# Patient Record
Sex: Female | Born: 1940 | Race: White | Hispanic: No | Smoking: Never smoker
Health system: Southern US, Community
[De-identification: ages and names within clinical notes are randomized; demographics above are authoritative.]

## PROBLEM LIST (undated history)

## (undated) DIAGNOSIS — R079 Chest pain, unspecified: Secondary | ICD-10-CM

## (undated) DIAGNOSIS — F419 Anxiety disorder, unspecified: Secondary | ICD-10-CM

## (undated) DIAGNOSIS — K5909 Other constipation: Secondary | ICD-10-CM

## (undated) DIAGNOSIS — Z853 Personal history of malignant neoplasm of breast: Secondary | ICD-10-CM

## (undated) DIAGNOSIS — F329 Major depressive disorder, single episode, unspecified: Secondary | ICD-10-CM

## (undated) DIAGNOSIS — H3320 Serous retinal detachment, unspecified eye: Secondary | ICD-10-CM

## (undated) DIAGNOSIS — E785 Hyperlipidemia, unspecified: Secondary | ICD-10-CM

## (undated) DIAGNOSIS — F32A Depression, unspecified: Secondary | ICD-10-CM

## (undated) DIAGNOSIS — C50212 Malignant neoplasm of upper-inner quadrant of left female breast: Secondary | ICD-10-CM

## (undated) DIAGNOSIS — K219 Gastro-esophageal reflux disease without esophagitis: Secondary | ICD-10-CM

## (undated) DIAGNOSIS — E039 Hypothyroidism, unspecified: Secondary | ICD-10-CM

## (undated) DIAGNOSIS — I10 Essential (primary) hypertension: Secondary | ICD-10-CM

## (undated) HISTORY — DX: Hyperlipidemia, unspecified: E78.5

## (undated) HISTORY — PX: CHOLECYSTECTOMY: SHX55

## (undated) HISTORY — PX: APPENDECTOMY: SHX54

## (undated) HISTORY — PX: MASTECTOMY: SHX3

## (undated) HISTORY — DX: Serous retinal detachment, unspecified eye: H33.20

## (undated) HISTORY — DX: Anxiety disorder, unspecified: F41.9

## (undated) HISTORY — DX: Gastro-esophageal reflux disease without esophagitis: K21.9

## (undated) HISTORY — PX: BREAST LUMPECTOMY: SHX2

## (undated) HISTORY — DX: Chest pain, unspecified: R07.9

## (undated) HISTORY — DX: Essential (primary) hypertension: I10

## (undated) HISTORY — PX: THYROIDECTOMY: SHX17

## (undated) HISTORY — DX: Personal history of malignant neoplasm of breast: Z85.3

## (undated) HISTORY — DX: Malignant neoplasm of upper-inner quadrant of left female breast: C50.212

## (undated) HISTORY — DX: Other constipation: K59.09

## (undated) HISTORY — DX: Depression, unspecified: F32.A

## (undated) HISTORY — DX: Hypothyroidism, unspecified: E03.9

---
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
## 1998-08-25 ENCOUNTER — Encounter: Payer: Self-pay | Admitting: Obstetrics & Gynecology

## 1998-08-25 ENCOUNTER — Ambulatory Visit (HOSPITAL_COMMUNITY): Admission: RE | Admit: 1998-08-25 | Discharge: 1998-08-25 | Payer: Self-pay | Admitting: Obstetrics & Gynecology

## 2003-07-30 ENCOUNTER — Ambulatory Visit (HOSPITAL_COMMUNITY): Admission: RE | Admit: 2003-07-30 | Discharge: 2003-07-30 | Payer: Self-pay | Admitting: Orthopedic Surgery

## 2010-01-27 DIAGNOSIS — C50212 Malignant neoplasm of upper-inner quadrant of left female breast: Secondary | ICD-10-CM | POA: Insufficient documentation

## 2011-05-10 HISTORY — PX: ESOPHAGOGASTRODUODENOSCOPY: SHX1529

## 2011-07-05 HISTORY — PX: COLONOSCOPY: SHX174

## 2014-02-11 DIAGNOSIS — C50212 Malignant neoplasm of upper-inner quadrant of left female breast: Secondary | ICD-10-CM | POA: Diagnosis not present

## 2014-02-11 DIAGNOSIS — Z9889 Other specified postprocedural states: Secondary | ICD-10-CM | POA: Diagnosis not present

## 2014-02-11 DIAGNOSIS — Z853 Personal history of malignant neoplasm of breast: Secondary | ICD-10-CM | POA: Diagnosis not present

## 2014-02-11 DIAGNOSIS — R928 Other abnormal and inconclusive findings on diagnostic imaging of breast: Secondary | ICD-10-CM | POA: Diagnosis not present

## 2014-03-05 DIAGNOSIS — I10 Essential (primary) hypertension: Secondary | ICD-10-CM | POA: Diagnosis not present

## 2014-03-05 DIAGNOSIS — E785 Hyperlipidemia, unspecified: Secondary | ICD-10-CM | POA: Diagnosis not present

## 2014-03-05 DIAGNOSIS — R079 Chest pain, unspecified: Secondary | ICD-10-CM | POA: Diagnosis not present

## 2014-03-05 DIAGNOSIS — R9430 Abnormal result of cardiovascular function study, unspecified: Secondary | ICD-10-CM | POA: Diagnosis not present

## 2014-04-14 DIAGNOSIS — E780 Pure hypercholesterolemia: Secondary | ICD-10-CM | POA: Diagnosis not present

## 2014-04-14 DIAGNOSIS — I209 Angina pectoris, unspecified: Secondary | ICD-10-CM | POA: Diagnosis not present

## 2014-04-14 DIAGNOSIS — I2510 Atherosclerotic heart disease of native coronary artery without angina pectoris: Secondary | ICD-10-CM | POA: Diagnosis not present

## 2014-04-27 DIAGNOSIS — H01005 Unspecified blepharitis left lower eyelid: Secondary | ICD-10-CM | POA: Diagnosis not present

## 2014-04-27 DIAGNOSIS — H01002 Unspecified blepharitis right lower eyelid: Secondary | ICD-10-CM | POA: Diagnosis not present

## 2014-04-27 DIAGNOSIS — H01004 Unspecified blepharitis left upper eyelid: Secondary | ICD-10-CM | POA: Diagnosis not present

## 2014-04-27 DIAGNOSIS — H01001 Unspecified blepharitis right upper eyelid: Secondary | ICD-10-CM | POA: Diagnosis not present

## 2014-06-03 DIAGNOSIS — R062 Wheezing: Secondary | ICD-10-CM | POA: Diagnosis not present

## 2014-06-21 DIAGNOSIS — J309 Allergic rhinitis, unspecified: Secondary | ICD-10-CM | POA: Diagnosis not present

## 2014-06-21 DIAGNOSIS — J209 Acute bronchitis, unspecified: Secondary | ICD-10-CM | POA: Diagnosis not present

## 2014-07-03 DIAGNOSIS — Z0000 Encounter for general adult medical examination without abnormal findings: Secondary | ICD-10-CM | POA: Diagnosis not present

## 2014-07-03 DIAGNOSIS — Z79899 Other long term (current) drug therapy: Secondary | ICD-10-CM | POA: Diagnosis not present

## 2014-07-03 DIAGNOSIS — E782 Mixed hyperlipidemia: Secondary | ICD-10-CM | POA: Diagnosis not present

## 2014-07-23 DIAGNOSIS — E7800 Pure hypercholesterolemia, unspecified: Secondary | ICD-10-CM

## 2014-07-23 DIAGNOSIS — R0789 Other chest pain: Secondary | ICD-10-CM

## 2014-07-23 DIAGNOSIS — R079 Chest pain, unspecified: Secondary | ICD-10-CM | POA: Diagnosis not present

## 2014-07-23 HISTORY — DX: Other chest pain: R07.89

## 2014-07-23 HISTORY — DX: Pure hypercholesterolemia, unspecified: E78.00

## 2014-08-26 DIAGNOSIS — I10 Essential (primary) hypertension: Secondary | ICD-10-CM | POA: Diagnosis not present

## 2014-08-26 DIAGNOSIS — L259 Unspecified contact dermatitis, unspecified cause: Secondary | ICD-10-CM | POA: Diagnosis not present

## 2014-09-07 DIAGNOSIS — H2000 Unspecified acute and subacute iridocyclitis: Secondary | ICD-10-CM | POA: Diagnosis not present

## 2014-09-15 DIAGNOSIS — H2000 Unspecified acute and subacute iridocyclitis: Secondary | ICD-10-CM | POA: Diagnosis not present

## 2014-09-18 DIAGNOSIS — F329 Major depressive disorder, single episode, unspecified: Secondary | ICD-10-CM | POA: Diagnosis not present

## 2014-09-18 DIAGNOSIS — Z79899 Other long term (current) drug therapy: Secondary | ICD-10-CM | POA: Diagnosis not present

## 2014-09-18 DIAGNOSIS — E039 Hypothyroidism, unspecified: Secondary | ICD-10-CM | POA: Diagnosis not present

## 2014-09-18 DIAGNOSIS — R252 Cramp and spasm: Secondary | ICD-10-CM | POA: Diagnosis not present

## 2014-09-30 DIAGNOSIS — H2000 Unspecified acute and subacute iridocyclitis: Secondary | ICD-10-CM | POA: Diagnosis not present

## 2014-10-08 DIAGNOSIS — M791 Myalgia: Secondary | ICD-10-CM | POA: Diagnosis not present

## 2014-10-08 DIAGNOSIS — M4316 Spondylolisthesis, lumbar region: Secondary | ICD-10-CM | POA: Diagnosis not present

## 2014-10-08 DIAGNOSIS — M545 Low back pain: Secondary | ICD-10-CM | POA: Diagnosis not present

## 2014-10-14 DIAGNOSIS — H2000 Unspecified acute and subacute iridocyclitis: Secondary | ICD-10-CM | POA: Diagnosis not present

## 2014-10-15 DIAGNOSIS — H2000 Unspecified acute and subacute iridocyclitis: Secondary | ICD-10-CM | POA: Diagnosis not present

## 2014-10-15 DIAGNOSIS — Z111 Encounter for screening for respiratory tuberculosis: Secondary | ICD-10-CM | POA: Diagnosis not present

## 2014-10-15 DIAGNOSIS — Z23 Encounter for immunization: Secondary | ICD-10-CM | POA: Diagnosis not present

## 2014-10-21 DIAGNOSIS — H2000 Unspecified acute and subacute iridocyclitis: Secondary | ICD-10-CM | POA: Diagnosis not present

## 2014-11-09 DIAGNOSIS — R55 Syncope and collapse: Secondary | ICD-10-CM

## 2014-11-09 DIAGNOSIS — R0789 Other chest pain: Secondary | ICD-10-CM | POA: Diagnosis not present

## 2014-11-09 DIAGNOSIS — I10 Essential (primary) hypertension: Secondary | ICD-10-CM

## 2014-11-09 DIAGNOSIS — E785 Hyperlipidemia, unspecified: Secondary | ICD-10-CM

## 2014-11-09 HISTORY — DX: Essential (primary) hypertension: I10

## 2014-11-09 HISTORY — DX: Hyperlipidemia, unspecified: E78.5

## 2014-11-09 HISTORY — DX: Syncope and collapse: R55

## 2014-11-10 DIAGNOSIS — H2000 Unspecified acute and subacute iridocyclitis: Secondary | ICD-10-CM | POA: Diagnosis not present

## 2014-11-27 DIAGNOSIS — R1011 Right upper quadrant pain: Secondary | ICD-10-CM | POA: Diagnosis not present

## 2014-11-27 DIAGNOSIS — K210 Gastro-esophageal reflux disease with esophagitis: Secondary | ICD-10-CM | POA: Diagnosis not present

## 2014-12-02 DIAGNOSIS — K838 Other specified diseases of biliary tract: Secondary | ICD-10-CM | POA: Diagnosis not present

## 2014-12-02 DIAGNOSIS — R109 Unspecified abdominal pain: Secondary | ICD-10-CM | POA: Diagnosis not present

## 2014-12-02 DIAGNOSIS — R110 Nausea: Secondary | ICD-10-CM | POA: Diagnosis not present

## 2014-12-03 DIAGNOSIS — K210 Gastro-esophageal reflux disease with esophagitis: Secondary | ICD-10-CM | POA: Diagnosis not present

## 2014-12-03 DIAGNOSIS — R1013 Epigastric pain: Secondary | ICD-10-CM | POA: Diagnosis not present

## 2014-12-03 DIAGNOSIS — R1011 Right upper quadrant pain: Secondary | ICD-10-CM | POA: Diagnosis not present

## 2014-12-04 DIAGNOSIS — R935 Abnormal findings on diagnostic imaging of other abdominal regions, including retroperitoneum: Secondary | ICD-10-CM | POA: Diagnosis not present

## 2014-12-04 DIAGNOSIS — M549 Dorsalgia, unspecified: Secondary | ICD-10-CM | POA: Diagnosis not present

## 2014-12-04 DIAGNOSIS — K838 Other specified diseases of biliary tract: Secondary | ICD-10-CM | POA: Diagnosis not present

## 2014-12-04 DIAGNOSIS — R933 Abnormal findings on diagnostic imaging of other parts of digestive tract: Secondary | ICD-10-CM | POA: Diagnosis not present

## 2014-12-07 DIAGNOSIS — R1013 Epigastric pain: Secondary | ICD-10-CM | POA: Diagnosis not present

## 2014-12-07 DIAGNOSIS — K8050 Calculus of bile duct without cholangitis or cholecystitis without obstruction: Secondary | ICD-10-CM | POA: Diagnosis not present

## 2014-12-07 DIAGNOSIS — K838 Other specified diseases of biliary tract: Secondary | ICD-10-CM | POA: Diagnosis not present

## 2014-12-07 DIAGNOSIS — K8043 Calculus of bile duct with acute cholecystitis with obstruction: Secondary | ICD-10-CM | POA: Diagnosis not present

## 2014-12-07 DIAGNOSIS — I10 Essential (primary) hypertension: Secondary | ICD-10-CM | POA: Diagnosis not present

## 2014-12-08 DIAGNOSIS — Z880 Allergy status to penicillin: Secondary | ICD-10-CM | POA: Diagnosis not present

## 2014-12-08 DIAGNOSIS — C50919 Malignant neoplasm of unspecified site of unspecified female breast: Secondary | ICD-10-CM | POA: Diagnosis not present

## 2014-12-08 DIAGNOSIS — E860 Dehydration: Secondary | ICD-10-CM | POA: Diagnosis not present

## 2014-12-08 DIAGNOSIS — R1084 Generalized abdominal pain: Secondary | ICD-10-CM | POA: Diagnosis not present

## 2014-12-08 DIAGNOSIS — K668 Other specified disorders of peritoneum: Secondary | ICD-10-CM | POA: Diagnosis not present

## 2014-12-08 DIAGNOSIS — K9171 Accidental puncture and laceration of a digestive system organ or structure during a digestive system procedure: Secondary | ICD-10-CM | POA: Diagnosis not present

## 2014-12-08 DIAGNOSIS — E039 Hypothyroidism, unspecified: Secondary | ICD-10-CM | POA: Diagnosis not present

## 2014-12-08 DIAGNOSIS — J9811 Atelectasis: Secondary | ICD-10-CM | POA: Diagnosis not present

## 2014-12-08 DIAGNOSIS — R0902 Hypoxemia: Secondary | ICD-10-CM | POA: Diagnosis not present

## 2014-12-08 DIAGNOSIS — Z7982 Long term (current) use of aspirin: Secondary | ICD-10-CM | POA: Diagnosis not present

## 2014-12-08 DIAGNOSIS — Z885 Allergy status to narcotic agent status: Secondary | ICD-10-CM | POA: Diagnosis not present

## 2014-12-08 DIAGNOSIS — I10 Essential (primary) hypertension: Secondary | ICD-10-CM | POA: Diagnosis not present

## 2014-12-08 DIAGNOSIS — F418 Other specified anxiety disorders: Secondary | ICD-10-CM | POA: Diagnosis not present

## 2014-12-08 DIAGNOSIS — K8070 Calculus of gallbladder and bile duct without cholecystitis without obstruction: Secondary | ICD-10-CM | POA: Diagnosis not present

## 2014-12-08 DIAGNOSIS — R112 Nausea with vomiting, unspecified: Secondary | ICD-10-CM | POA: Diagnosis not present

## 2014-12-08 DIAGNOSIS — K8050 Calculus of bile duct without cholangitis or cholecystitis without obstruction: Secondary | ICD-10-CM | POA: Diagnosis not present

## 2014-12-08 DIAGNOSIS — K8020 Calculus of gallbladder without cholecystitis without obstruction: Secondary | ICD-10-CM | POA: Diagnosis not present

## 2014-12-08 DIAGNOSIS — R0689 Other abnormalities of breathing: Secondary | ICD-10-CM | POA: Diagnosis not present

## 2014-12-08 DIAGNOSIS — R0602 Shortness of breath: Secondary | ICD-10-CM | POA: Diagnosis not present

## 2014-12-08 DIAGNOSIS — Z853 Personal history of malignant neoplasm of breast: Secondary | ICD-10-CM | POA: Diagnosis not present

## 2014-12-08 DIAGNOSIS — Z881 Allergy status to other antibiotic agents status: Secondary | ICD-10-CM | POA: Diagnosis not present

## 2014-12-08 DIAGNOSIS — Z79899 Other long term (current) drug therapy: Secondary | ICD-10-CM | POA: Diagnosis not present

## 2014-12-08 DIAGNOSIS — R109 Unspecified abdominal pain: Secondary | ICD-10-CM | POA: Diagnosis not present

## 2014-12-08 DIAGNOSIS — Z882 Allergy status to sulfonamides status: Secondary | ICD-10-CM | POA: Diagnosis not present

## 2014-12-08 DIAGNOSIS — K219 Gastro-esophageal reflux disease without esophagitis: Secondary | ICD-10-CM | POA: Diagnosis not present

## 2014-12-08 DIAGNOSIS — K859 Acute pancreatitis without necrosis or infection, unspecified: Secondary | ICD-10-CM | POA: Diagnosis not present

## 2014-12-08 DIAGNOSIS — Z4682 Encounter for fitting and adjustment of non-vascular catheter: Secondary | ICD-10-CM | POA: Diagnosis not present

## 2014-12-08 DIAGNOSIS — Z23 Encounter for immunization: Secondary | ICD-10-CM | POA: Diagnosis not present

## 2014-12-08 DIAGNOSIS — Z888 Allergy status to other drugs, medicaments and biological substances status: Secondary | ICD-10-CM | POA: Diagnosis not present

## 2014-12-08 DIAGNOSIS — R935 Abnormal findings on diagnostic imaging of other abdominal regions, including retroperitoneum: Secondary | ICD-10-CM | POA: Diagnosis not present

## 2014-12-08 DIAGNOSIS — K5909 Other constipation: Secondary | ICD-10-CM | POA: Diagnosis not present

## 2014-12-14 DIAGNOSIS — Z7982 Long term (current) use of aspirin: Secondary | ICD-10-CM | POA: Diagnosis not present

## 2014-12-14 DIAGNOSIS — K668 Other specified disorders of peritoneum: Secondary | ICD-10-CM | POA: Diagnosis not present

## 2014-12-14 DIAGNOSIS — K5909 Other constipation: Secondary | ICD-10-CM | POA: Diagnosis not present

## 2014-12-14 DIAGNOSIS — K859 Acute pancreatitis without necrosis or infection, unspecified: Secondary | ICD-10-CM | POA: Diagnosis not present

## 2014-12-14 DIAGNOSIS — Z888 Allergy status to other drugs, medicaments and biological substances status: Secondary | ICD-10-CM | POA: Diagnosis not present

## 2014-12-14 DIAGNOSIS — R0689 Other abnormalities of breathing: Secondary | ICD-10-CM | POA: Diagnosis not present

## 2014-12-14 DIAGNOSIS — F418 Other specified anxiety disorders: Secondary | ICD-10-CM | POA: Diagnosis not present

## 2014-12-14 DIAGNOSIS — I10 Essential (primary) hypertension: Secondary | ICD-10-CM | POA: Diagnosis not present

## 2014-12-14 DIAGNOSIS — E039 Hypothyroidism, unspecified: Secondary | ICD-10-CM | POA: Diagnosis not present

## 2014-12-14 DIAGNOSIS — Z853 Personal history of malignant neoplasm of breast: Secondary | ICD-10-CM | POA: Diagnosis not present

## 2014-12-14 DIAGNOSIS — Z79899 Other long term (current) drug therapy: Secondary | ICD-10-CM | POA: Diagnosis not present

## 2014-12-14 DIAGNOSIS — K9171 Accidental puncture and laceration of a digestive system organ or structure during a digestive system procedure: Secondary | ICD-10-CM | POA: Diagnosis not present

## 2014-12-14 DIAGNOSIS — K8070 Calculus of gallbladder and bile duct without cholecystitis without obstruction: Secondary | ICD-10-CM | POA: Diagnosis not present

## 2014-12-14 DIAGNOSIS — Z880 Allergy status to penicillin: Secondary | ICD-10-CM | POA: Diagnosis not present

## 2014-12-14 DIAGNOSIS — Z881 Allergy status to other antibiotic agents status: Secondary | ICD-10-CM | POA: Diagnosis not present

## 2014-12-14 DIAGNOSIS — Z23 Encounter for immunization: Secondary | ICD-10-CM | POA: Diagnosis not present

## 2014-12-14 DIAGNOSIS — Z882 Allergy status to sulfonamides status: Secondary | ICD-10-CM | POA: Diagnosis not present

## 2014-12-14 DIAGNOSIS — Z885 Allergy status to narcotic agent status: Secondary | ICD-10-CM | POA: Diagnosis not present

## 2014-12-14 DIAGNOSIS — K219 Gastro-esophageal reflux disease without esophagitis: Secondary | ICD-10-CM | POA: Diagnosis not present

## 2014-12-14 DIAGNOSIS — E860 Dehydration: Secondary | ICD-10-CM | POA: Diagnosis not present

## 2014-12-15 DIAGNOSIS — K8020 Calculus of gallbladder without cholecystitis without obstruction: Secondary | ICD-10-CM | POA: Diagnosis not present

## 2014-12-15 DIAGNOSIS — R2681 Unsteadiness on feet: Secondary | ICD-10-CM | POA: Diagnosis not present

## 2014-12-15 DIAGNOSIS — K8050 Calculus of bile duct without cholangitis or cholecystitis without obstruction: Secondary | ICD-10-CM | POA: Diagnosis not present

## 2014-12-15 DIAGNOSIS — K832 Perforation of bile duct: Secondary | ICD-10-CM | POA: Diagnosis not present

## 2014-12-16 DIAGNOSIS — K811 Chronic cholecystitis: Secondary | ICD-10-CM | POA: Diagnosis not present

## 2014-12-16 DIAGNOSIS — K8020 Calculus of gallbladder without cholecystitis without obstruction: Secondary | ICD-10-CM | POA: Diagnosis not present

## 2014-12-16 DIAGNOSIS — K832 Perforation of bile duct: Secondary | ICD-10-CM | POA: Diagnosis not present

## 2014-12-16 DIAGNOSIS — R2681 Unsteadiness on feet: Secondary | ICD-10-CM | POA: Diagnosis not present

## 2014-12-16 DIAGNOSIS — R1011 Right upper quadrant pain: Secondary | ICD-10-CM | POA: Diagnosis not present

## 2014-12-16 DIAGNOSIS — R1013 Epigastric pain: Secondary | ICD-10-CM | POA: Diagnosis not present

## 2014-12-16 DIAGNOSIS — R110 Nausea: Secondary | ICD-10-CM | POA: Diagnosis not present

## 2014-12-16 DIAGNOSIS — K8050 Calculus of bile duct without cholangitis or cholecystitis without obstruction: Secondary | ICD-10-CM | POA: Diagnosis not present

## 2014-12-18 DIAGNOSIS — K832 Perforation of bile duct: Secondary | ICD-10-CM | POA: Diagnosis not present

## 2014-12-18 DIAGNOSIS — K8020 Calculus of gallbladder without cholecystitis without obstruction: Secondary | ICD-10-CM | POA: Diagnosis not present

## 2014-12-18 DIAGNOSIS — R2681 Unsteadiness on feet: Secondary | ICD-10-CM | POA: Diagnosis not present

## 2014-12-18 DIAGNOSIS — K8050 Calculus of bile duct without cholangitis or cholecystitis without obstruction: Secondary | ICD-10-CM | POA: Diagnosis not present

## 2014-12-21 DIAGNOSIS — K8050 Calculus of bile duct without cholangitis or cholecystitis without obstruction: Secondary | ICD-10-CM | POA: Diagnosis not present

## 2014-12-21 DIAGNOSIS — R2681 Unsteadiness on feet: Secondary | ICD-10-CM | POA: Diagnosis not present

## 2014-12-21 DIAGNOSIS — K8020 Calculus of gallbladder without cholecystitis without obstruction: Secondary | ICD-10-CM | POA: Diagnosis not present

## 2014-12-21 DIAGNOSIS — K832 Perforation of bile duct: Secondary | ICD-10-CM | POA: Diagnosis not present

## 2014-12-23 DIAGNOSIS — K8020 Calculus of gallbladder without cholecystitis without obstruction: Secondary | ICD-10-CM | POA: Diagnosis not present

## 2014-12-23 DIAGNOSIS — R2681 Unsteadiness on feet: Secondary | ICD-10-CM | POA: Diagnosis not present

## 2014-12-23 DIAGNOSIS — K832 Perforation of bile duct: Secondary | ICD-10-CM | POA: Diagnosis not present

## 2014-12-23 DIAGNOSIS — K8050 Calculus of bile duct without cholangitis or cholecystitis without obstruction: Secondary | ICD-10-CM | POA: Diagnosis not present

## 2014-12-25 DIAGNOSIS — K8050 Calculus of bile duct without cholangitis or cholecystitis without obstruction: Secondary | ICD-10-CM | POA: Diagnosis not present

## 2014-12-25 DIAGNOSIS — K8020 Calculus of gallbladder without cholecystitis without obstruction: Secondary | ICD-10-CM | POA: Diagnosis not present

## 2014-12-25 DIAGNOSIS — R2681 Unsteadiness on feet: Secondary | ICD-10-CM | POA: Diagnosis not present

## 2014-12-25 DIAGNOSIS — K832 Perforation of bile duct: Secondary | ICD-10-CM | POA: Diagnosis not present

## 2014-12-28 DIAGNOSIS — E7800 Pure hypercholesterolemia, unspecified: Secondary | ICD-10-CM | POA: Diagnosis not present

## 2014-12-28 DIAGNOSIS — R0789 Other chest pain: Secondary | ICD-10-CM | POA: Diagnosis not present

## 2014-12-28 DIAGNOSIS — I10 Essential (primary) hypertension: Secondary | ICD-10-CM | POA: Diagnosis not present

## 2014-12-28 DIAGNOSIS — E785 Hyperlipidemia, unspecified: Secondary | ICD-10-CM | POA: Diagnosis not present

## 2015-01-04 DIAGNOSIS — K811 Chronic cholecystitis: Secondary | ICD-10-CM | POA: Diagnosis not present

## 2015-01-04 DIAGNOSIS — Z79899 Other long term (current) drug therapy: Secondary | ICD-10-CM | POA: Diagnosis not present

## 2015-01-04 DIAGNOSIS — K8010 Calculus of gallbladder with chronic cholecystitis without obstruction: Secondary | ICD-10-CM | POA: Diagnosis not present

## 2015-01-04 DIAGNOSIS — K8020 Calculus of gallbladder without cholecystitis without obstruction: Secondary | ICD-10-CM | POA: Diagnosis not present

## 2015-01-06 DIAGNOSIS — K8020 Calculus of gallbladder without cholecystitis without obstruction: Secondary | ICD-10-CM | POA: Diagnosis not present

## 2015-01-06 DIAGNOSIS — K832 Perforation of bile duct: Secondary | ICD-10-CM | POA: Diagnosis not present

## 2015-01-06 DIAGNOSIS — R2681 Unsteadiness on feet: Secondary | ICD-10-CM | POA: Diagnosis not present

## 2015-01-06 DIAGNOSIS — K8050 Calculus of bile duct without cholangitis or cholecystitis without obstruction: Secondary | ICD-10-CM | POA: Diagnosis not present

## 2015-01-07 DIAGNOSIS — R2681 Unsteadiness on feet: Secondary | ICD-10-CM | POA: Diagnosis not present

## 2015-01-07 DIAGNOSIS — K8020 Calculus of gallbladder without cholecystitis without obstruction: Secondary | ICD-10-CM | POA: Diagnosis not present

## 2015-01-07 DIAGNOSIS — K832 Perforation of bile duct: Secondary | ICD-10-CM | POA: Diagnosis not present

## 2015-01-07 DIAGNOSIS — K8050 Calculus of bile duct without cholangitis or cholecystitis without obstruction: Secondary | ICD-10-CM | POA: Diagnosis not present

## 2015-01-12 DIAGNOSIS — K8050 Calculus of bile duct without cholangitis or cholecystitis without obstruction: Secondary | ICD-10-CM | POA: Diagnosis not present

## 2015-01-12 DIAGNOSIS — R2681 Unsteadiness on feet: Secondary | ICD-10-CM | POA: Diagnosis not present

## 2015-01-12 DIAGNOSIS — K8020 Calculus of gallbladder without cholecystitis without obstruction: Secondary | ICD-10-CM | POA: Diagnosis not present

## 2015-01-12 DIAGNOSIS — K832 Perforation of bile duct: Secondary | ICD-10-CM | POA: Diagnosis not present

## 2015-01-21 DIAGNOSIS — R1032 Left lower quadrant pain: Secondary | ICD-10-CM | POA: Diagnosis not present

## 2015-01-21 DIAGNOSIS — G8929 Other chronic pain: Secondary | ICD-10-CM | POA: Diagnosis not present

## 2015-01-21 DIAGNOSIS — J069 Acute upper respiratory infection, unspecified: Secondary | ICD-10-CM | POA: Diagnosis not present

## 2015-01-21 DIAGNOSIS — R1031 Right lower quadrant pain: Secondary | ICD-10-CM | POA: Diagnosis not present

## 2015-03-01 DIAGNOSIS — Z853 Personal history of malignant neoplasm of breast: Secondary | ICD-10-CM | POA: Diagnosis not present

## 2015-03-01 DIAGNOSIS — M8580 Other specified disorders of bone density and structure, unspecified site: Secondary | ICD-10-CM | POA: Diagnosis not present

## 2016-06-14 DIAGNOSIS — Z853 Personal history of malignant neoplasm of breast: Secondary | ICD-10-CM | POA: Diagnosis not present

## 2016-06-14 DIAGNOSIS — D229 Melanocytic nevi, unspecified: Secondary | ICD-10-CM | POA: Diagnosis not present

## 2016-10-13 DIAGNOSIS — H40052 Ocular hypertension, left eye: Secondary | ICD-10-CM | POA: Diagnosis not present

## 2016-10-27 DIAGNOSIS — J329 Chronic sinusitis, unspecified: Secondary | ICD-10-CM | POA: Diagnosis not present

## 2016-10-27 DIAGNOSIS — J40 Bronchitis, not specified as acute or chronic: Secondary | ICD-10-CM | POA: Diagnosis not present

## 2016-10-27 DIAGNOSIS — R739 Hyperglycemia, unspecified: Secondary | ICD-10-CM | POA: Diagnosis not present

## 2016-10-27 DIAGNOSIS — E039 Hypothyroidism, unspecified: Secondary | ICD-10-CM | POA: Diagnosis not present

## 2016-10-27 DIAGNOSIS — Z6823 Body mass index (BMI) 23.0-23.9, adult: Secondary | ICD-10-CM | POA: Diagnosis not present

## 2016-11-06 DIAGNOSIS — M436 Torticollis: Secondary | ICD-10-CM | POA: Diagnosis not present

## 2016-11-13 DIAGNOSIS — L570 Actinic keratosis: Secondary | ICD-10-CM | POA: Diagnosis not present

## 2016-11-13 DIAGNOSIS — D0471 Carcinoma in situ of skin of right lower limb, including hip: Secondary | ICD-10-CM | POA: Diagnosis not present

## 2016-11-24 DIAGNOSIS — M9901 Segmental and somatic dysfunction of cervical region: Secondary | ICD-10-CM | POA: Diagnosis not present

## 2016-11-24 DIAGNOSIS — M542 Cervicalgia: Secondary | ICD-10-CM | POA: Diagnosis not present

## 2016-11-24 DIAGNOSIS — M9902 Segmental and somatic dysfunction of thoracic region: Secondary | ICD-10-CM | POA: Diagnosis not present

## 2016-11-27 DIAGNOSIS — M9902 Segmental and somatic dysfunction of thoracic region: Secondary | ICD-10-CM | POA: Diagnosis not present

## 2016-11-27 DIAGNOSIS — M542 Cervicalgia: Secondary | ICD-10-CM | POA: Diagnosis not present

## 2016-11-27 DIAGNOSIS — M9901 Segmental and somatic dysfunction of cervical region: Secondary | ICD-10-CM | POA: Diagnosis not present

## 2016-11-29 DIAGNOSIS — M9902 Segmental and somatic dysfunction of thoracic region: Secondary | ICD-10-CM | POA: Diagnosis not present

## 2016-11-29 DIAGNOSIS — M542 Cervicalgia: Secondary | ICD-10-CM | POA: Diagnosis not present

## 2016-11-29 DIAGNOSIS — M9901 Segmental and somatic dysfunction of cervical region: Secondary | ICD-10-CM | POA: Diagnosis not present

## 2016-12-04 DIAGNOSIS — Z6824 Body mass index (BMI) 24.0-24.9, adult: Secondary | ICD-10-CM | POA: Diagnosis not present

## 2016-12-04 DIAGNOSIS — H6691 Otitis media, unspecified, right ear: Secondary | ICD-10-CM | POA: Diagnosis not present

## 2016-12-04 DIAGNOSIS — I998 Other disorder of circulatory system: Secondary | ICD-10-CM | POA: Diagnosis not present

## 2016-12-04 DIAGNOSIS — H6090 Unspecified otitis externa, unspecified ear: Secondary | ICD-10-CM | POA: Diagnosis not present

## 2016-12-13 DIAGNOSIS — H04123 Dry eye syndrome of bilateral lacrimal glands: Secondary | ICD-10-CM | POA: Diagnosis not present

## 2017-01-08 DIAGNOSIS — Z23 Encounter for immunization: Secondary | ICD-10-CM | POA: Diagnosis not present

## 2017-01-23 DIAGNOSIS — H0100A Unspecified blepharitis right eye, upper and lower eyelids: Secondary | ICD-10-CM | POA: Diagnosis not present

## 2017-01-23 DIAGNOSIS — H0100B Unspecified blepharitis left eye, upper and lower eyelids: Secondary | ICD-10-CM | POA: Diagnosis not present

## 2017-02-09 DIAGNOSIS — H40052 Ocular hypertension, left eye: Secondary | ICD-10-CM | POA: Diagnosis not present

## 2017-03-12 DIAGNOSIS — H40052 Ocular hypertension, left eye: Secondary | ICD-10-CM | POA: Diagnosis not present

## 2017-03-26 DIAGNOSIS — L719 Rosacea, unspecified: Secondary | ICD-10-CM | POA: Diagnosis not present

## 2017-03-26 DIAGNOSIS — L820 Inflamed seborrheic keratosis: Secondary | ICD-10-CM | POA: Diagnosis not present

## 2017-03-26 DIAGNOSIS — D0471 Carcinoma in situ of skin of right lower limb, including hip: Secondary | ICD-10-CM | POA: Diagnosis not present

## 2017-03-27 DIAGNOSIS — H10503 Unspecified blepharoconjunctivitis, bilateral: Secondary | ICD-10-CM | POA: Diagnosis not present

## 2017-03-28 DIAGNOSIS — K210 Gastro-esophageal reflux disease with esophagitis: Secondary | ICD-10-CM | POA: Diagnosis not present

## 2017-03-28 DIAGNOSIS — E039 Hypothyroidism, unspecified: Secondary | ICD-10-CM | POA: Diagnosis not present

## 2017-04-04 DIAGNOSIS — Z853 Personal history of malignant neoplasm of breast: Secondary | ICD-10-CM | POA: Diagnosis not present

## 2017-04-04 DIAGNOSIS — Z79899 Other long term (current) drug therapy: Secondary | ICD-10-CM | POA: Diagnosis not present

## 2017-04-04 DIAGNOSIS — R413 Other amnesia: Secondary | ICD-10-CM | POA: Diagnosis not present

## 2017-04-18 DIAGNOSIS — C50212 Malignant neoplasm of upper-inner quadrant of left female breast: Secondary | ICD-10-CM | POA: Diagnosis not present

## 2017-04-18 DIAGNOSIS — R928 Other abnormal and inconclusive findings on diagnostic imaging of breast: Secondary | ICD-10-CM | POA: Diagnosis not present

## 2017-04-18 DIAGNOSIS — N6312 Unspecified lump in the right breast, upper inner quadrant: Secondary | ICD-10-CM | POA: Diagnosis not present

## 2017-04-20 DIAGNOSIS — R928 Other abnormal and inconclusive findings on diagnostic imaging of breast: Secondary | ICD-10-CM | POA: Diagnosis not present

## 2017-04-25 DIAGNOSIS — R921 Mammographic calcification found on diagnostic imaging of breast: Secondary | ICD-10-CM | POA: Diagnosis not present

## 2017-04-25 DIAGNOSIS — C50411 Malignant neoplasm of upper-outer quadrant of right female breast: Secondary | ICD-10-CM | POA: Diagnosis not present

## 2017-04-25 DIAGNOSIS — R928 Other abnormal and inconclusive findings on diagnostic imaging of breast: Secondary | ICD-10-CM | POA: Diagnosis not present

## 2017-04-25 DIAGNOSIS — C50211 Malignant neoplasm of upper-inner quadrant of right female breast: Secondary | ICD-10-CM

## 2017-04-25 DIAGNOSIS — N6311 Unspecified lump in the right breast, upper outer quadrant: Secondary | ICD-10-CM | POA: Diagnosis not present

## 2017-04-25 DIAGNOSIS — C50911 Malignant neoplasm of unspecified site of right female breast: Secondary | ICD-10-CM | POA: Diagnosis not present

## 2017-04-25 DIAGNOSIS — N642 Atrophy of breast: Secondary | ICD-10-CM | POA: Diagnosis not present

## 2017-04-25 DIAGNOSIS — N6312 Unspecified lump in the right breast, upper inner quadrant: Secondary | ICD-10-CM | POA: Diagnosis not present

## 2017-04-25 HISTORY — DX: Malignant neoplasm of upper-inner quadrant of right female breast: C50.211

## 2017-05-03 DIAGNOSIS — M8580 Other specified disorders of bone density and structure, unspecified site: Secondary | ICD-10-CM | POA: Diagnosis not present

## 2017-05-03 DIAGNOSIS — C50212 Malignant neoplasm of upper-inner quadrant of left female breast: Secondary | ICD-10-CM | POA: Diagnosis not present

## 2017-05-03 DIAGNOSIS — Z853 Personal history of malignant neoplasm of breast: Secondary | ICD-10-CM | POA: Diagnosis not present

## 2017-05-03 DIAGNOSIS — Z170 Estrogen receptor positive status [ER+]: Secondary | ICD-10-CM | POA: Diagnosis not present

## 2017-05-03 DIAGNOSIS — M8589 Other specified disorders of bone density and structure, multiple sites: Secondary | ICD-10-CM | POA: Diagnosis not present

## 2017-05-03 DIAGNOSIS — C50911 Malignant neoplasm of unspecified site of right female breast: Secondary | ICD-10-CM | POA: Diagnosis not present

## 2017-05-03 DIAGNOSIS — Z923 Personal history of irradiation: Secondary | ICD-10-CM | POA: Diagnosis not present

## 2017-05-03 DIAGNOSIS — R413 Other amnesia: Secondary | ICD-10-CM | POA: Diagnosis not present

## 2017-05-07 DIAGNOSIS — C50411 Malignant neoplasm of upper-outer quadrant of right female breast: Secondary | ICD-10-CM

## 2017-05-07 DIAGNOSIS — Z170 Estrogen receptor positive status [ER+]: Secondary | ICD-10-CM | POA: Insufficient documentation

## 2017-05-07 HISTORY — DX: Estrogen receptor positive status (ER+): Z17.0

## 2017-05-07 HISTORY — DX: Malignant neoplasm of upper-outer quadrant of right female breast: C50.411

## 2017-05-10 DIAGNOSIS — N6312 Unspecified lump in the right breast, upper inner quadrant: Secondary | ICD-10-CM | POA: Diagnosis not present

## 2017-05-10 DIAGNOSIS — C50411 Malignant neoplasm of upper-outer quadrant of right female breast: Secondary | ICD-10-CM | POA: Diagnosis not present

## 2017-05-10 DIAGNOSIS — R21 Rash and other nonspecific skin eruption: Secondary | ICD-10-CM | POA: Diagnosis not present

## 2017-05-10 DIAGNOSIS — C50212 Malignant neoplasm of upper-inner quadrant of left female breast: Secondary | ICD-10-CM | POA: Diagnosis not present

## 2017-05-10 DIAGNOSIS — C50412 Malignant neoplasm of upper-outer quadrant of left female breast: Secondary | ICD-10-CM | POA: Diagnosis not present

## 2017-05-14 DIAGNOSIS — Z8669 Personal history of other diseases of the nervous system and sense organs: Secondary | ICD-10-CM | POA: Diagnosis not present

## 2017-05-14 DIAGNOSIS — H40052 Ocular hypertension, left eye: Secondary | ICD-10-CM | POA: Diagnosis not present

## 2017-05-15 DIAGNOSIS — H40052 Ocular hypertension, left eye: Secondary | ICD-10-CM | POA: Diagnosis not present

## 2017-06-01 DIAGNOSIS — K219 Gastro-esophageal reflux disease without esophagitis: Secondary | ICD-10-CM | POA: Diagnosis not present

## 2017-06-01 DIAGNOSIS — C50411 Malignant neoplasm of upper-outer quadrant of right female breast: Secondary | ICD-10-CM | POA: Diagnosis not present

## 2017-06-01 DIAGNOSIS — Z01810 Encounter for preprocedural cardiovascular examination: Secondary | ICD-10-CM | POA: Diagnosis not present

## 2017-06-01 DIAGNOSIS — I10 Essential (primary) hypertension: Secondary | ICD-10-CM | POA: Diagnosis not present

## 2017-06-01 DIAGNOSIS — Z170 Estrogen receptor positive status [ER+]: Secondary | ICD-10-CM | POA: Diagnosis not present

## 2017-06-01 DIAGNOSIS — C50011 Malignant neoplasm of nipple and areola, right female breast: Secondary | ICD-10-CM | POA: Diagnosis not present

## 2017-06-01 DIAGNOSIS — Z79899 Other long term (current) drug therapy: Secondary | ICD-10-CM | POA: Diagnosis not present

## 2017-06-01 DIAGNOSIS — E079 Disorder of thyroid, unspecified: Secondary | ICD-10-CM | POA: Diagnosis not present

## 2017-06-01 DIAGNOSIS — C50211 Malignant neoplasm of upper-inner quadrant of right female breast: Secondary | ICD-10-CM | POA: Diagnosis not present

## 2017-06-12 DIAGNOSIS — Z09 Encounter for follow-up examination after completed treatment for conditions other than malignant neoplasm: Secondary | ICD-10-CM

## 2017-06-12 HISTORY — DX: Encounter for follow-up examination after completed treatment for conditions other than malignant neoplasm: Z09

## 2017-06-21 DIAGNOSIS — H40052 Ocular hypertension, left eye: Secondary | ICD-10-CM | POA: Diagnosis not present

## 2017-07-06 DIAGNOSIS — C50811 Malignant neoplasm of overlapping sites of right female breast: Secondary | ICD-10-CM | POA: Diagnosis not present

## 2017-07-06 DIAGNOSIS — Z79899 Other long term (current) drug therapy: Secondary | ICD-10-CM | POA: Diagnosis not present

## 2017-07-06 DIAGNOSIS — Z853 Personal history of malignant neoplasm of breast: Secondary | ICD-10-CM | POA: Diagnosis not present

## 2017-07-06 DIAGNOSIS — R531 Weakness: Secondary | ICD-10-CM | POA: Diagnosis not present

## 2017-07-06 DIAGNOSIS — M8589 Other specified disorders of bone density and structure, multiple sites: Secondary | ICD-10-CM | POA: Diagnosis not present

## 2017-07-06 DIAGNOSIS — M8580 Other specified disorders of bone density and structure, unspecified site: Secondary | ICD-10-CM | POA: Diagnosis not present

## 2017-07-06 DIAGNOSIS — Z923 Personal history of irradiation: Secondary | ICD-10-CM | POA: Diagnosis not present

## 2017-07-06 DIAGNOSIS — Z170 Estrogen receptor positive status [ER+]: Secondary | ICD-10-CM | POA: Diagnosis not present

## 2017-07-06 DIAGNOSIS — C50911 Malignant neoplasm of unspecified site of right female breast: Secondary | ICD-10-CM | POA: Diagnosis not present

## 2017-07-06 DIAGNOSIS — R590 Localized enlarged lymph nodes: Secondary | ICD-10-CM | POA: Diagnosis not present

## 2017-07-06 DIAGNOSIS — R413 Other amnesia: Secondary | ICD-10-CM | POA: Diagnosis not present

## 2017-07-11 DIAGNOSIS — H40052 Ocular hypertension, left eye: Secondary | ICD-10-CM | POA: Diagnosis not present

## 2017-07-16 DIAGNOSIS — M8589 Other specified disorders of bone density and structure, multiple sites: Secondary | ICD-10-CM | POA: Diagnosis not present

## 2017-07-24 DIAGNOSIS — C50212 Malignant neoplasm of upper-inner quadrant of left female breast: Secondary | ICD-10-CM | POA: Diagnosis not present

## 2017-07-24 DIAGNOSIS — C50911 Malignant neoplasm of unspecified site of right female breast: Secondary | ICD-10-CM | POA: Diagnosis not present

## 2017-07-27 DIAGNOSIS — Z853 Personal history of malignant neoplasm of breast: Secondary | ICD-10-CM | POA: Diagnosis not present

## 2017-07-27 DIAGNOSIS — Z79811 Long term (current) use of aromatase inhibitors: Secondary | ICD-10-CM

## 2017-07-27 DIAGNOSIS — R590 Localized enlarged lymph nodes: Secondary | ICD-10-CM

## 2017-07-27 DIAGNOSIS — M85859 Other specified disorders of bone density and structure, unspecified thigh: Secondary | ICD-10-CM

## 2017-07-27 DIAGNOSIS — C50212 Malignant neoplasm of upper-inner quadrant of left female breast: Secondary | ICD-10-CM | POA: Diagnosis not present

## 2017-07-27 DIAGNOSIS — C50911 Malignant neoplasm of unspecified site of right female breast: Secondary | ICD-10-CM | POA: Diagnosis not present

## 2017-07-27 DIAGNOSIS — Z923 Personal history of irradiation: Secondary | ICD-10-CM | POA: Diagnosis not present

## 2017-07-27 DIAGNOSIS — R413 Other amnesia: Secondary | ICD-10-CM

## 2017-07-27 DIAGNOSIS — Z170 Estrogen receptor positive status [ER+]: Secondary | ICD-10-CM | POA: Diagnosis not present

## 2017-07-27 DIAGNOSIS — C50811 Malignant neoplasm of overlapping sites of right female breast: Secondary | ICD-10-CM | POA: Diagnosis not present

## 2017-07-27 DIAGNOSIS — M8589 Other specified disorders of bone density and structure, multiple sites: Secondary | ICD-10-CM | POA: Diagnosis not present

## 2017-07-31 DIAGNOSIS — H40052 Ocular hypertension, left eye: Secondary | ICD-10-CM | POA: Diagnosis not present

## 2017-08-06 DIAGNOSIS — C50212 Malignant neoplasm of upper-inner quadrant of left female breast: Secondary | ICD-10-CM | POA: Diagnosis not present

## 2017-08-14 DIAGNOSIS — M79621 Pain in right upper arm: Secondary | ICD-10-CM | POA: Insufficient documentation

## 2017-08-14 HISTORY — DX: Pain in right upper arm: M79.621

## 2017-09-11 DIAGNOSIS — C50411 Malignant neoplasm of upper-outer quadrant of right female breast: Secondary | ICD-10-CM | POA: Diagnosis not present

## 2017-09-11 DIAGNOSIS — M79621 Pain in right upper arm: Secondary | ICD-10-CM | POA: Diagnosis not present

## 2017-09-11 DIAGNOSIS — Z170 Estrogen receptor positive status [ER+]: Secondary | ICD-10-CM | POA: Diagnosis not present

## 2017-09-13 DIAGNOSIS — C50411 Malignant neoplasm of upper-outer quadrant of right female breast: Secondary | ICD-10-CM | POA: Diagnosis not present

## 2017-09-13 DIAGNOSIS — C50212 Malignant neoplasm of upper-inner quadrant of left female breast: Secondary | ICD-10-CM | POA: Diagnosis not present

## 2017-09-13 DIAGNOSIS — N6489 Other specified disorders of breast: Secondary | ICD-10-CM | POA: Diagnosis not present

## 2017-09-13 DIAGNOSIS — M79621 Pain in right upper arm: Secondary | ICD-10-CM | POA: Diagnosis not present

## 2017-09-18 DIAGNOSIS — H01001 Unspecified blepharitis right upper eyelid: Secondary | ICD-10-CM | POA: Diagnosis not present

## 2017-09-26 DIAGNOSIS — Z853 Personal history of malignant neoplasm of breast: Secondary | ICD-10-CM | POA: Diagnosis not present

## 2017-09-26 DIAGNOSIS — M8589 Other specified disorders of bone density and structure, multiple sites: Secondary | ICD-10-CM | POA: Diagnosis not present

## 2017-09-26 DIAGNOSIS — C50911 Malignant neoplasm of unspecified site of right female breast: Secondary | ICD-10-CM | POA: Diagnosis not present

## 2017-09-26 DIAGNOSIS — R413 Other amnesia: Secondary | ICD-10-CM

## 2017-09-26 DIAGNOSIS — Z923 Personal history of irradiation: Secondary | ICD-10-CM | POA: Diagnosis not present

## 2017-09-26 DIAGNOSIS — M8580 Other specified disorders of bone density and structure, unspecified site: Secondary | ICD-10-CM

## 2017-09-26 DIAGNOSIS — Z9011 Acquired absence of right breast and nipple: Secondary | ICD-10-CM

## 2017-09-26 DIAGNOSIS — R222 Localized swelling, mass and lump, trunk: Secondary | ICD-10-CM

## 2017-09-26 DIAGNOSIS — Z79811 Long term (current) use of aromatase inhibitors: Secondary | ICD-10-CM

## 2017-09-26 DIAGNOSIS — Z170 Estrogen receptor positive status [ER+]: Secondary | ICD-10-CM | POA: Diagnosis not present

## 2017-09-26 DIAGNOSIS — D171 Benign lipomatous neoplasm of skin and subcutaneous tissue of trunk: Secondary | ICD-10-CM

## 2017-10-01 DIAGNOSIS — R222 Localized swelling, mass and lump, trunk: Secondary | ICD-10-CM | POA: Insufficient documentation

## 2017-10-01 DIAGNOSIS — Z170 Estrogen receptor positive status [ER+]: Secondary | ICD-10-CM | POA: Diagnosis not present

## 2017-10-01 DIAGNOSIS — C50411 Malignant neoplasm of upper-outer quadrant of right female breast: Secondary | ICD-10-CM | POA: Diagnosis not present

## 2017-10-01 HISTORY — DX: Localized swelling, mass and lump, trunk: R22.2

## 2017-10-10 DIAGNOSIS — Z853 Personal history of malignant neoplasm of breast: Secondary | ICD-10-CM | POA: Diagnosis not present

## 2017-10-10 DIAGNOSIS — N6312 Unspecified lump in the right breast, upper inner quadrant: Secondary | ICD-10-CM | POA: Diagnosis not present

## 2017-10-10 DIAGNOSIS — R928 Other abnormal and inconclusive findings on diagnostic imaging of breast: Secondary | ICD-10-CM | POA: Diagnosis not present

## 2017-10-10 DIAGNOSIS — N6489 Other specified disorders of breast: Secondary | ICD-10-CM | POA: Diagnosis not present

## 2017-10-17 DIAGNOSIS — R928 Other abnormal and inconclusive findings on diagnostic imaging of breast: Secondary | ICD-10-CM | POA: Diagnosis not present

## 2017-10-17 DIAGNOSIS — N63 Unspecified lump in unspecified breast: Secondary | ICD-10-CM | POA: Diagnosis not present

## 2017-10-17 DIAGNOSIS — N641 Fat necrosis of breast: Secondary | ICD-10-CM | POA: Diagnosis not present

## 2017-10-17 DIAGNOSIS — Z9011 Acquired absence of right breast and nipple: Secondary | ICD-10-CM | POA: Diagnosis not present

## 2017-10-17 DIAGNOSIS — N6312 Unspecified lump in the right breast, upper inner quadrant: Secondary | ICD-10-CM | POA: Diagnosis not present

## 2017-10-17 DIAGNOSIS — N6311 Unspecified lump in the right breast, upper outer quadrant: Secondary | ICD-10-CM | POA: Diagnosis not present

## 2017-10-18 DIAGNOSIS — J40 Bronchitis, not specified as acute or chronic: Secondary | ICD-10-CM | POA: Diagnosis not present

## 2017-10-18 DIAGNOSIS — J329 Chronic sinusitis, unspecified: Secondary | ICD-10-CM | POA: Diagnosis not present

## 2017-10-19 DIAGNOSIS — H40052 Ocular hypertension, left eye: Secondary | ICD-10-CM | POA: Diagnosis not present

## 2017-11-26 DIAGNOSIS — H8120 Vestibular neuronitis, unspecified ear: Secondary | ICD-10-CM | POA: Diagnosis not present

## 2017-11-26 DIAGNOSIS — K210 Gastro-esophageal reflux disease with esophagitis: Secondary | ICD-10-CM | POA: Diagnosis not present

## 2017-11-28 DIAGNOSIS — M8580 Other specified disorders of bone density and structure, unspecified site: Secondary | ICD-10-CM

## 2017-11-28 DIAGNOSIS — D171 Benign lipomatous neoplasm of skin and subcutaneous tissue of trunk: Secondary | ICD-10-CM

## 2017-11-28 DIAGNOSIS — Z853 Personal history of malignant neoplasm of breast: Secondary | ICD-10-CM | POA: Diagnosis not present

## 2017-11-28 DIAGNOSIS — Z923 Personal history of irradiation: Secondary | ICD-10-CM | POA: Diagnosis not present

## 2017-11-28 DIAGNOSIS — C50911 Malignant neoplasm of unspecified site of right female breast: Secondary | ICD-10-CM | POA: Diagnosis not present

## 2017-11-28 DIAGNOSIS — R413 Other amnesia: Secondary | ICD-10-CM

## 2017-11-28 DIAGNOSIS — M8589 Other specified disorders of bone density and structure, multiple sites: Secondary | ICD-10-CM | POA: Diagnosis not present

## 2017-11-28 DIAGNOSIS — Z170 Estrogen receptor positive status [ER+]: Secondary | ICD-10-CM | POA: Diagnosis not present

## 2017-11-28 DIAGNOSIS — R222 Localized swelling, mass and lump, trunk: Secondary | ICD-10-CM

## 2017-11-28 DIAGNOSIS — Z79811 Long term (current) use of aromatase inhibitors: Secondary | ICD-10-CM

## 2017-11-28 DIAGNOSIS — C50212 Malignant neoplasm of upper-inner quadrant of left female breast: Secondary | ICD-10-CM | POA: Diagnosis not present

## 2017-11-28 DIAGNOSIS — Z9011 Acquired absence of right breast and nipple: Secondary | ICD-10-CM

## 2017-12-03 DIAGNOSIS — H40052 Ocular hypertension, left eye: Secondary | ICD-10-CM | POA: Diagnosis not present

## 2017-12-05 DIAGNOSIS — Z23 Encounter for immunization: Secondary | ICD-10-CM | POA: Diagnosis not present

## 2017-12-17 DIAGNOSIS — H40052 Ocular hypertension, left eye: Secondary | ICD-10-CM | POA: Diagnosis not present

## 2017-12-27 DIAGNOSIS — K210 Gastro-esophageal reflux disease with esophagitis: Secondary | ICD-10-CM | POA: Diagnosis not present

## 2017-12-27 DIAGNOSIS — J40 Bronchitis, not specified as acute or chronic: Secondary | ICD-10-CM | POA: Diagnosis not present

## 2017-12-27 DIAGNOSIS — J329 Chronic sinusitis, unspecified: Secondary | ICD-10-CM | POA: Diagnosis not present

## 2017-12-27 DIAGNOSIS — R252 Cramp and spasm: Secondary | ICD-10-CM | POA: Diagnosis not present

## 2018-01-07 DIAGNOSIS — L600 Ingrowing nail: Secondary | ICD-10-CM | POA: Diagnosis not present

## 2018-01-14 DIAGNOSIS — L600 Ingrowing nail: Secondary | ICD-10-CM

## 2018-01-14 DIAGNOSIS — L03032 Cellulitis of left toe: Secondary | ICD-10-CM | POA: Insufficient documentation

## 2018-01-14 HISTORY — DX: Cellulitis of left toe: L03.032

## 2018-01-14 HISTORY — DX: Ingrowing nail: L60.0

## 2018-01-18 DIAGNOSIS — H40052 Ocular hypertension, left eye: Secondary | ICD-10-CM | POA: Diagnosis not present

## 2018-01-23 DIAGNOSIS — H40052 Ocular hypertension, left eye: Secondary | ICD-10-CM | POA: Diagnosis not present

## 2018-02-11 DIAGNOSIS — Z923 Personal history of irradiation: Secondary | ICD-10-CM | POA: Diagnosis not present

## 2018-02-11 DIAGNOSIS — R222 Localized swelling, mass and lump, trunk: Secondary | ICD-10-CM

## 2018-02-11 DIAGNOSIS — M8589 Other specified disorders of bone density and structure, multiple sites: Secondary | ICD-10-CM | POA: Diagnosis not present

## 2018-02-11 DIAGNOSIS — M8580 Other specified disorders of bone density and structure, unspecified site: Secondary | ICD-10-CM | POA: Diagnosis not present

## 2018-02-11 DIAGNOSIS — Z79899 Other long term (current) drug therapy: Secondary | ICD-10-CM | POA: Diagnosis not present

## 2018-02-11 DIAGNOSIS — Z9011 Acquired absence of right breast and nipple: Secondary | ICD-10-CM | POA: Diagnosis not present

## 2018-02-11 DIAGNOSIS — C50212 Malignant neoplasm of upper-inner quadrant of left female breast: Secondary | ICD-10-CM | POA: Diagnosis not present

## 2018-02-11 DIAGNOSIS — D171 Benign lipomatous neoplasm of skin and subcutaneous tissue of trunk: Secondary | ICD-10-CM

## 2018-02-11 DIAGNOSIS — R413 Other amnesia: Secondary | ICD-10-CM

## 2018-02-11 DIAGNOSIS — Z853 Personal history of malignant neoplasm of breast: Secondary | ICD-10-CM | POA: Diagnosis not present

## 2018-02-13 DIAGNOSIS — H40052 Ocular hypertension, left eye: Secondary | ICD-10-CM | POA: Diagnosis not present

## 2018-02-27 DIAGNOSIS — H2000 Unspecified acute and subacute iridocyclitis: Secondary | ICD-10-CM | POA: Diagnosis not present

## 2018-03-07 DIAGNOSIS — H2000 Unspecified acute and subacute iridocyclitis: Secondary | ICD-10-CM | POA: Diagnosis not present

## 2018-03-10 DIAGNOSIS — N3090 Cystitis, unspecified without hematuria: Secondary | ICD-10-CM | POA: Diagnosis not present

## 2018-03-10 DIAGNOSIS — N3001 Acute cystitis with hematuria: Secondary | ICD-10-CM | POA: Diagnosis not present

## 2018-04-09 DIAGNOSIS — H40052 Ocular hypertension, left eye: Secondary | ICD-10-CM | POA: Diagnosis not present

## 2018-04-30 DIAGNOSIS — H40052 Ocular hypertension, left eye: Secondary | ICD-10-CM | POA: Diagnosis not present

## 2018-06-17 DIAGNOSIS — C50911 Malignant neoplasm of unspecified site of right female breast: Secondary | ICD-10-CM | POA: Diagnosis not present

## 2018-06-17 DIAGNOSIS — R21 Rash and other nonspecific skin eruption: Secondary | ICD-10-CM | POA: Diagnosis not present

## 2018-07-08 DIAGNOSIS — H40052 Ocular hypertension, left eye: Secondary | ICD-10-CM | POA: Diagnosis not present

## 2018-07-29 DIAGNOSIS — Z9011 Acquired absence of right breast and nipple: Secondary | ICD-10-CM | POA: Diagnosis not present

## 2018-07-29 DIAGNOSIS — Z1231 Encounter for screening mammogram for malignant neoplasm of breast: Secondary | ICD-10-CM | POA: Diagnosis not present

## 2018-07-29 DIAGNOSIS — R928 Other abnormal and inconclusive findings on diagnostic imaging of breast: Secondary | ICD-10-CM | POA: Diagnosis not present

## 2018-07-30 DIAGNOSIS — Z853 Personal history of malignant neoplasm of breast: Secondary | ICD-10-CM | POA: Diagnosis not present

## 2018-07-30 DIAGNOSIS — M8589 Other specified disorders of bone density and structure, multiple sites: Secondary | ICD-10-CM | POA: Diagnosis not present

## 2018-07-31 DIAGNOSIS — D485 Neoplasm of uncertain behavior of skin: Secondary | ICD-10-CM | POA: Diagnosis not present

## 2018-07-31 DIAGNOSIS — L300 Nummular dermatitis: Secondary | ICD-10-CM | POA: Diagnosis not present

## 2018-07-31 DIAGNOSIS — L308 Other specified dermatitis: Secondary | ICD-10-CM | POA: Diagnosis not present

## 2018-07-31 DIAGNOSIS — L299 Pruritus, unspecified: Secondary | ICD-10-CM | POA: Diagnosis not present

## 2018-08-15 DIAGNOSIS — L300 Nummular dermatitis: Secondary | ICD-10-CM | POA: Diagnosis not present

## 2018-09-16 DIAGNOSIS — E039 Hypothyroidism, unspecified: Secondary | ICD-10-CM | POA: Diagnosis not present

## 2018-09-16 DIAGNOSIS — D519 Vitamin B12 deficiency anemia, unspecified: Secondary | ICD-10-CM | POA: Diagnosis not present

## 2018-09-16 DIAGNOSIS — Z79899 Other long term (current) drug therapy: Secondary | ICD-10-CM | POA: Diagnosis not present

## 2018-09-16 DIAGNOSIS — I10 Essential (primary) hypertension: Secondary | ICD-10-CM | POA: Diagnosis not present

## 2018-09-16 DIAGNOSIS — K210 Gastro-esophageal reflux disease with esophagitis: Secondary | ICD-10-CM | POA: Diagnosis not present

## 2018-09-16 DIAGNOSIS — R252 Cramp and spasm: Secondary | ICD-10-CM | POA: Diagnosis not present

## 2018-09-19 DIAGNOSIS — H40052 Ocular hypertension, left eye: Secondary | ICD-10-CM | POA: Diagnosis not present

## 2018-09-25 DIAGNOSIS — H40052 Ocular hypertension, left eye: Secondary | ICD-10-CM | POA: Diagnosis not present

## 2018-09-25 DIAGNOSIS — L300 Nummular dermatitis: Secondary | ICD-10-CM | POA: Diagnosis not present

## 2018-09-30 DIAGNOSIS — Z9011 Acquired absence of right breast and nipple: Secondary | ICD-10-CM | POA: Diagnosis not present

## 2018-09-30 DIAGNOSIS — C50212 Malignant neoplasm of upper-inner quadrant of left female breast: Secondary | ICD-10-CM | POA: Diagnosis not present

## 2018-11-01 DIAGNOSIS — M545 Low back pain: Secondary | ICD-10-CM | POA: Diagnosis not present

## 2018-11-06 DIAGNOSIS — C50212 Malignant neoplasm of upper-inner quadrant of left female breast: Secondary | ICD-10-CM | POA: Diagnosis not present

## 2018-11-06 DIAGNOSIS — M8589 Other specified disorders of bone density and structure, multiple sites: Secondary | ICD-10-CM | POA: Diagnosis not present

## 2018-11-11 DIAGNOSIS — M9902 Segmental and somatic dysfunction of thoracic region: Secondary | ICD-10-CM | POA: Diagnosis not present

## 2018-11-11 DIAGNOSIS — M9903 Segmental and somatic dysfunction of lumbar region: Secondary | ICD-10-CM | POA: Diagnosis not present

## 2018-11-11 DIAGNOSIS — M545 Low back pain: Secondary | ICD-10-CM | POA: Diagnosis not present

## 2018-11-11 DIAGNOSIS — M9905 Segmental and somatic dysfunction of pelvic region: Secondary | ICD-10-CM | POA: Diagnosis not present

## 2018-11-18 DIAGNOSIS — H40052 Ocular hypertension, left eye: Secondary | ICD-10-CM | POA: Diagnosis not present

## 2018-11-21 DIAGNOSIS — M5136 Other intervertebral disc degeneration, lumbar region: Secondary | ICD-10-CM | POA: Diagnosis not present

## 2018-11-23 DIAGNOSIS — M5136 Other intervertebral disc degeneration, lumbar region: Secondary | ICD-10-CM | POA: Diagnosis not present

## 2018-11-23 DIAGNOSIS — M48061 Spinal stenosis, lumbar region without neurogenic claudication: Secondary | ICD-10-CM | POA: Diagnosis not present

## 2018-11-23 DIAGNOSIS — M5126 Other intervertebral disc displacement, lumbar region: Secondary | ICD-10-CM | POA: Diagnosis not present

## 2018-11-26 DIAGNOSIS — M5136 Other intervertebral disc degeneration, lumbar region: Secondary | ICD-10-CM | POA: Diagnosis not present

## 2018-11-26 DIAGNOSIS — Z23 Encounter for immunization: Secondary | ICD-10-CM | POA: Diagnosis not present

## 2018-11-28 DIAGNOSIS — Z79899 Other long term (current) drug therapy: Secondary | ICD-10-CM | POA: Diagnosis not present

## 2018-11-28 DIAGNOSIS — M5416 Radiculopathy, lumbar region: Secondary | ICD-10-CM | POA: Diagnosis not present

## 2018-11-28 DIAGNOSIS — M5136 Other intervertebral disc degeneration, lumbar region: Secondary | ICD-10-CM | POA: Diagnosis not present

## 2019-01-07 DIAGNOSIS — H40052 Ocular hypertension, left eye: Secondary | ICD-10-CM | POA: Diagnosis not present

## 2019-02-17 DIAGNOSIS — H40052 Ocular hypertension, left eye: Secondary | ICD-10-CM | POA: Diagnosis not present

## 2019-02-27 DIAGNOSIS — L820 Inflamed seborrheic keratosis: Secondary | ICD-10-CM | POA: Diagnosis not present

## 2019-02-27 DIAGNOSIS — L578 Other skin changes due to chronic exposure to nonionizing radiation: Secondary | ICD-10-CM | POA: Diagnosis not present

## 2019-02-27 DIAGNOSIS — L728 Other follicular cysts of the skin and subcutaneous tissue: Secondary | ICD-10-CM | POA: Diagnosis not present

## 2019-02-27 DIAGNOSIS — L821 Other seborrheic keratosis: Secondary | ICD-10-CM | POA: Diagnosis not present

## 2019-03-17 DIAGNOSIS — H40052 Ocular hypertension, left eye: Secondary | ICD-10-CM | POA: Diagnosis not present

## 2019-04-06 DIAGNOSIS — I10 Essential (primary) hypertension: Secondary | ICD-10-CM | POA: Diagnosis not present

## 2019-04-15 DIAGNOSIS — C50212 Malignant neoplasm of upper-inner quadrant of left female breast: Secondary | ICD-10-CM | POA: Diagnosis not present

## 2019-04-15 DIAGNOSIS — E559 Vitamin D deficiency, unspecified: Secondary | ICD-10-CM | POA: Diagnosis not present

## 2019-04-15 DIAGNOSIS — D0511 Intraductal carcinoma in situ of right breast: Secondary | ICD-10-CM | POA: Diagnosis not present

## 2019-04-15 DIAGNOSIS — Z79899 Other long term (current) drug therapy: Secondary | ICD-10-CM | POA: Diagnosis not present

## 2019-04-15 DIAGNOSIS — M8589 Other specified disorders of bone density and structure, multiple sites: Secondary | ICD-10-CM | POA: Diagnosis not present

## 2019-04-18 ENCOUNTER — Encounter: Payer: Self-pay | Admitting: Gastroenterology

## 2019-04-22 DIAGNOSIS — M47814 Spondylosis without myelopathy or radiculopathy, thoracic region: Secondary | ICD-10-CM | POA: Diagnosis not present

## 2019-04-22 DIAGNOSIS — C50212 Malignant neoplasm of upper-inner quadrant of left female breast: Secondary | ICD-10-CM | POA: Diagnosis not present

## 2019-04-23 DIAGNOSIS — H40052 Ocular hypertension, left eye: Secondary | ICD-10-CM | POA: Diagnosis not present

## 2019-04-28 DIAGNOSIS — R531 Weakness: Secondary | ICD-10-CM | POA: Diagnosis not present

## 2019-04-28 DIAGNOSIS — L309 Dermatitis, unspecified: Secondary | ICD-10-CM | POA: Diagnosis not present

## 2019-04-28 DIAGNOSIS — D485 Neoplasm of uncertain behavior of skin: Secondary | ICD-10-CM | POA: Diagnosis not present

## 2019-04-28 DIAGNOSIS — L259 Unspecified contact dermatitis, unspecified cause: Secondary | ICD-10-CM | POA: Diagnosis not present

## 2019-05-02 ENCOUNTER — Other Ambulatory Visit: Payer: Self-pay

## 2019-05-06 DIAGNOSIS — L821 Other seborrheic keratosis: Secondary | ICD-10-CM | POA: Diagnosis not present

## 2019-05-06 DIAGNOSIS — L578 Other skin changes due to chronic exposure to nonionizing radiation: Secondary | ICD-10-CM | POA: Diagnosis not present

## 2019-05-06 DIAGNOSIS — L300 Nummular dermatitis: Secondary | ICD-10-CM | POA: Diagnosis not present

## 2019-05-07 DIAGNOSIS — K219 Gastro-esophageal reflux disease without esophagitis: Secondary | ICD-10-CM | POA: Diagnosis not present

## 2019-05-07 DIAGNOSIS — E039 Hypothyroidism, unspecified: Secondary | ICD-10-CM | POA: Diagnosis not present

## 2019-05-19 ENCOUNTER — Other Ambulatory Visit: Payer: Self-pay

## 2019-05-19 ENCOUNTER — Ambulatory Visit: Payer: Medicare Other | Admitting: Gastroenterology

## 2019-05-19 ENCOUNTER — Encounter: Payer: Self-pay | Admitting: Gastroenterology

## 2019-05-19 VITALS — BP 126/70 | HR 69 | Temp 97.5°F | Ht 64.5 in | Wt 147.4 lb

## 2019-05-19 DIAGNOSIS — R197 Diarrhea, unspecified: Secondary | ICD-10-CM | POA: Diagnosis not present

## 2019-05-19 DIAGNOSIS — K219 Gastro-esophageal reflux disease without esophagitis: Secondary | ICD-10-CM | POA: Diagnosis not present

## 2019-05-19 MED ORDER — PANTOPRAZOLE SODIUM 40 MG PO TBEC: 40.0000 mg | DELAYED_RELEASE_TABLET | Freq: Two times a day (BID) | ORAL | 3 refills | Status: DC

## 2019-05-19 MED ORDER — FAMOTIDINE 20 MG PO TABS: 20.0000 mg | ORAL_TABLET | Freq: Every day | ORAL | 3 refills | Status: DC

## 2019-05-19 NOTE — Patient Instructions (Signed)
If you are age 81 or older, your body mass index should be between 23-30. Your Body mass index is 29.47 kg/m. If this is out of the aforementioned range listed, please consider follow up with your Primary Care Provider.  If you are age 50 or younger, your body mass index should be between 19-25. Your Body mass index is 29.47 kg/m. If this is out of the aformentioned range listed, please consider follow up with your Primary Care Provider.   We have sent the following medications to your pharmacy for you to pick up at your convenience: Protonix  Pepcid  Stop Magnesium and Calcium supplements x 2 weeks.   Please call Dr. Leland Her nurse Larena Glassman, CMA)  in 2 weeks at [phone]  to let her now how you are doing.    Follow up in 8 weeks.   Thank you,  Dr. Jackquline Denmark

## 2019-05-19 NOTE — Progress Notes (Signed)
Chief Complaint:   Referring Provider:  No ref. provider found      ASSESSMENT AND PLAN;   #1. GERD with HH/belching  #2. RLQ/RUQ Abd pain  #3. Diarrhea d/t ? meds (Mg supplements)   Plan: -protonix 40mg  po bid, #60, 1/2 hr before breakfast and supper. -pepcid 20mg  po qhs -Stop mag/ca supplentts x 2 weeks. -If still with problems and the above WU is negative, proceed with CT scan abdo/pelvis followed by EGD. -Call in 2 weeks. -FU in 8 weeks.    HPI:    Holly Welch is a 79 y.o. female  C/O belching, upper abdominal pain/discomfort especially after eating with associated intermittent diarrhea x 3 to 4 months.  Also has been having right lower quadrant abdominal discomfort. She has previous history of constipation.  Denies having any melena or hematochezia.  Denies having any significant nausea or vomiting.  No weight loss.  She had normal CBC with hemoglobin 12.15 April 2019, normal BUN/creatinine, normal LFTs.  Her TSH was normal.  Protonix was increased.  She also has tried simethicone without any significant improvement.  Diarrhea has been intermittent with the frequency of 2-3 bowel movements per day especially after eating.  Mostly loose watery in consistency without nocturnal symptoms.  On further asking she does admit that her problems started when she started taking magnesium.    Past GI procedures: -Colonoscopy 06/2011 ([facility]): Mild sigmoid diverticulosis, small internal hemorrhoids, otherwise normal to TI. -EGD 05/2011: 3 cm HH, incidental gastric polyps s/p polypectomy. Bx-fundic gland polyps. The small bowel biopsies were negative for celiac disease.  Additional past medical history: -Stage IA L breast cancer 01/2010 treated with lumpectomy/PO XRT.  Unable to tolerate hormonal therapy.  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 Past Medical History:  Diagnosis Date  . Anxiety and depression   . Chronic constipation   .  Detached retina   . GERD (gastroesophageal reflux disease)   . History of left breast cancer   . Hypothyroid     Past Surgical History:  Procedure Laterality Date  . APPENDECTOMY    . BREAST LUMPECTOMY    . CHOLECYSTECTOMY    . COLONOSCOPY  07/05/2011   Mild sigmoid diverticulosis. Small internal hemorrhoids. Otherwise normal colonoscopy.   . ESOPHAGOGASTRODUODENOSCOPY  05/10/2011   Hiatal hernia. Incidental gastric polyp.   Marland Kitchen MASTECTOMY     partial on left 8 years ago and right was 2 years ago  . THYROIDECTOMY      Family History  Problem Relation Age of Onset  . Breast cancer Paternal Aunt   . Colon cancer Neg Hx   . Esophageal cancer Neg Hx     Social History   Tobacco Use  . Smoking status: Never Smoker  . Smokeless tobacco: Never Used  Substance Use Topics  . Alcohol use: Not Currently  . Drug use: Never    Current Outpatient Medications  Medication Sig Dispense Refill  . ALPRAZolam (XANAX) 0.25 MG tablet Take 0.5-1 tablets by mouth as needed.     . citalopram (CELEXA) 20 MG tablet Take 20 mg by mouth daily.    . famotidine (PEPCID) 20 MG tablet Take 20 mg by mouth daily.    Marland Kitchen gabapentin (NEURONTIN) 100 MG capsule Take 100 mg by mouth as needed.    Marland Kitchen levothyroxine (SYNTHROID) 75 MCG tablet Take 1 tablet by mouth daily.    Marland Kitchen lisinopril-hydrochlorothiazide (ZESTORETIC) 10-12.5 MG tablet Take 1 tablet by mouth daily.    Marland Kitchen  loratadine (CLARITIN) 10 MG tablet Take 20 mg by mouth daily with breakfast.    . MAGNESIUM PO Take 2 tablets by mouth daily.    . metoprolol tartrate (LOPRESSOR) 25 MG tablet Take 12.5 mg by mouth 2 (two) times daily.    . Multiple Minerals-Vitamins (CALCIUM & VIT D3 BONE HEALTH PO) Take 2 tablets by mouth daily. Chewables    . pantoprazole (PROTONIX) 40 MG tablet Take 40 mg by mouth daily.     No current facility-administered medications for this visit.    Allergies  Allergen Reactions  . Cefdinir Nausea Only  . Hydrocodone-Acetaminophen  Other (See Comments)  . Latuda [Lurasidone]   . Prednisone   . Codeine Other (See Comments) and Rash  . Penicillins Rash and Swelling  . Sulfa Antibiotics Rash    Review of Systems:  Constitutional: Denies fever, chills, diaphoresis, appetite change and fatigue.  HEENT: Denies photophobia, eye pain, redness, hearing loss, ear pain, congestion, sore throat, rhinorrhea, sneezing, mouth sores, neck pain, neck stiffness and tinnitus.   Respiratory: Denies SOB, DOE, cough, chest tightness,  and wheezing.   Cardiovascular: Denies chest pain, palpitations and leg swelling.  Genitourinary: Denies dysuria, urgency, frequency, hematuria, flank pain and difficulty urinating.  Musculoskeletal: Denies myalgias, back pain, joint swelling, arthralgias and gait problem.  Skin: No rash.  Neurological: Denies dizziness, seizures, syncope, weakness, light-headedness, numbness and headaches.  Hematological: Denies adenopathy. Easy bruising, personal or family bleeding history  Psychiatric/Behavioral: Has anxiety or depression     Physical Exam:    BP 126/70   Pulse 69   Temp (!) 97.5 F (36.4 C)   Ht 5' 4.5" (1.638 m)   Wt 174 lb 6 oz (79.1 kg)   BMI 29.47 kg/m  Wt Readings from Last 3 Encounters:  05/19/19 174 lb 6 oz (79.1 kg)   Constitutional:  Well-developed, in no acute distress. Psychiatric: Normal mood and affect. Behavior is normal. HEENT: Pupils normal.  Conjunctivae are normal. No scleral icterus. Neck supple.  Cardiovascular: Normal rate, regular rhythm. No edema Pulmonary/chest: Effort normal and breath sounds normal. No wheezing, rales or rhonchi. Abdominal: Soft, nondistended. Nontender. Bowel sounds active throughout. There are no masses palpable. No hepatomegaly. Rectal:  defered Neurological: Alert and oriented to person place and time. Skin: Skin is warm and dry. No rashes noted.  Data Reviewed: I have personally reviewed following labs and imaging studies    Carmell Austria, MD 05/19/2019, 10:31 AM  Cc: No ref. provider found

## 2019-05-20 ENCOUNTER — Telehealth: Payer: Self-pay | Admitting: Gastroenterology

## 2019-05-20 NOTE — Telephone Encounter (Signed)
Pls call pt, she states that she noticed a mistake on her summary visit and would like to rectify.

## 2019-05-20 NOTE — Telephone Encounter (Signed)
Patient says that on her AVS that her weight was recorded as 174 lb and she weighed 147.6 lbs yesterday. Patient would like weight corrected in the computer.

## 2019-05-21 NOTE — Telephone Encounter (Signed)
Its been corrected 

## 2019-05-22 ENCOUNTER — Telehealth: Payer: Self-pay | Admitting: Gastroenterology

## 2019-06-06 DIAGNOSIS — K219 Gastro-esophageal reflux disease without esophagitis: Secondary | ICD-10-CM | POA: Diagnosis not present

## 2019-06-06 DIAGNOSIS — I10 Essential (primary) hypertension: Secondary | ICD-10-CM | POA: Diagnosis not present

## 2019-06-18 ENCOUNTER — Telehealth: Payer: Self-pay | Admitting: Gastroenterology

## 2019-06-18 MED ORDER — FAMOTIDINE 40 MG PO TABS: 40.0000 mg | ORAL_TABLET | Freq: Every day | ORAL | 3 refills | Status: DC

## 2019-06-18 NOTE — Telephone Encounter (Signed)
Patient is calling with an update, patient states that she has held the Calcium and Magnesium but does not feel like that has helped any. Patient states that she has been taking the Pepcid 20 mg at bedtime but does not feel like it is working. Patient would like to know if you would up it to Pepcid 40 mg at bedtime.

## 2019-06-18 NOTE — Telephone Encounter (Signed)
I have called and left message for patient to return my call.  

## 2019-06-18 NOTE — Telephone Encounter (Signed)
Lets increase Pepcid to 40 mg p.o. nightly She needs to call us in 2 weeks to let us know how she is doing-even if she is better  RG

## 2019-06-18 NOTE — Telephone Encounter (Signed)
Pls call pt. She wants to update you on her progress.

## 2019-06-18 NOTE — Telephone Encounter (Signed)
I have sent prescription to patients pharmacy and left message for patient to return my call.

## 2019-07-03 DIAGNOSIS — H40052 Ocular hypertension, left eye: Secondary | ICD-10-CM | POA: Diagnosis not present

## 2019-07-07 DIAGNOSIS — K219 Gastro-esophageal reflux disease without esophagitis: Secondary | ICD-10-CM | POA: Diagnosis not present

## 2019-07-07 DIAGNOSIS — E039 Hypothyroidism, unspecified: Secondary | ICD-10-CM | POA: Diagnosis not present

## 2019-07-07 DIAGNOSIS — I10 Essential (primary) hypertension: Secondary | ICD-10-CM | POA: Diagnosis not present

## 2019-07-07 DIAGNOSIS — E1142 Type 2 diabetes mellitus with diabetic polyneuropathy: Secondary | ICD-10-CM | POA: Diagnosis not present

## 2019-07-21 DIAGNOSIS — M8589 Other specified disorders of bone density and structure, multiple sites: Secondary | ICD-10-CM | POA: Diagnosis not present

## 2019-07-21 DIAGNOSIS — M810 Age-related osteoporosis without current pathological fracture: Secondary | ICD-10-CM | POA: Diagnosis not present

## 2019-07-31 ENCOUNTER — Ambulatory Visit: Payer: Medicare Other | Admitting: Gastroenterology

## 2019-08-06 DIAGNOSIS — E782 Mixed hyperlipidemia: Secondary | ICD-10-CM | POA: Diagnosis not present

## 2019-08-06 DIAGNOSIS — I10 Essential (primary) hypertension: Secondary | ICD-10-CM | POA: Diagnosis not present

## 2019-08-12 DIAGNOSIS — Z9011 Acquired absence of right breast and nipple: Secondary | ICD-10-CM | POA: Diagnosis not present

## 2019-08-12 DIAGNOSIS — Z1231 Encounter for screening mammogram for malignant neoplasm of breast: Secondary | ICD-10-CM | POA: Diagnosis not present

## 2019-08-15 DIAGNOSIS — M810 Age-related osteoporosis without current pathological fracture: Secondary | ICD-10-CM | POA: Diagnosis not present

## 2019-08-15 DIAGNOSIS — E559 Vitamin D deficiency, unspecified: Secondary | ICD-10-CM | POA: Diagnosis not present

## 2019-08-15 DIAGNOSIS — M546 Pain in thoracic spine: Secondary | ICD-10-CM | POA: Diagnosis not present

## 2019-08-15 DIAGNOSIS — N641 Fat necrosis of breast: Secondary | ICD-10-CM | POA: Diagnosis not present

## 2019-08-15 DIAGNOSIS — Z79811 Long term (current) use of aromatase inhibitors: Secondary | ICD-10-CM | POA: Diagnosis not present

## 2019-08-15 DIAGNOSIS — C50212 Malignant neoplasm of upper-inner quadrant of left female breast: Secondary | ICD-10-CM | POA: Diagnosis not present

## 2019-08-15 DIAGNOSIS — E538 Deficiency of other specified B group vitamins: Secondary | ICD-10-CM | POA: Diagnosis not present

## 2019-08-15 DIAGNOSIS — D509 Iron deficiency anemia, unspecified: Secondary | ICD-10-CM | POA: Diagnosis not present

## 2019-08-15 DIAGNOSIS — Z853 Personal history of malignant neoplasm of breast: Secondary | ICD-10-CM | POA: Diagnosis not present

## 2019-08-15 DIAGNOSIS — E871 Hypo-osmolality and hyponatremia: Secondary | ICD-10-CM | POA: Diagnosis not present

## 2019-08-25 DIAGNOSIS — C50212 Malignant neoplasm of upper-inner quadrant of left female breast: Secondary | ICD-10-CM | POA: Diagnosis not present

## 2019-08-25 DIAGNOSIS — M8589 Other specified disorders of bone density and structure, multiple sites: Secondary | ICD-10-CM | POA: Diagnosis not present

## 2019-09-06 DIAGNOSIS — I10 Essential (primary) hypertension: Secondary | ICD-10-CM | POA: Diagnosis not present

## 2019-09-06 DIAGNOSIS — E782 Mixed hyperlipidemia: Secondary | ICD-10-CM | POA: Diagnosis not present

## 2019-09-29 DIAGNOSIS — Z79899 Other long term (current) drug therapy: Secondary | ICD-10-CM | POA: Diagnosis not present

## 2019-09-29 DIAGNOSIS — M545 Low back pain: Secondary | ICD-10-CM | POA: Diagnosis not present

## 2019-09-29 DIAGNOSIS — E039 Hypothyroidism, unspecified: Secondary | ICD-10-CM | POA: Diagnosis not present

## 2019-09-29 DIAGNOSIS — I10 Essential (primary) hypertension: Secondary | ICD-10-CM | POA: Diagnosis not present

## 2019-09-29 DIAGNOSIS — R739 Hyperglycemia, unspecified: Secondary | ICD-10-CM | POA: Diagnosis not present

## 2019-09-29 DIAGNOSIS — M4727 Other spondylosis with radiculopathy, lumbosacral region: Secondary | ICD-10-CM | POA: Diagnosis not present

## 2019-09-29 DIAGNOSIS — E782 Mixed hyperlipidemia: Secondary | ICD-10-CM | POA: Diagnosis not present

## 2019-10-01 DIAGNOSIS — C50212 Malignant neoplasm of upper-inner quadrant of left female breast: Secondary | ICD-10-CM | POA: Diagnosis not present

## 2019-10-07 DIAGNOSIS — I10 Essential (primary) hypertension: Secondary | ICD-10-CM | POA: Diagnosis not present

## 2019-10-07 DIAGNOSIS — E782 Mixed hyperlipidemia: Secondary | ICD-10-CM | POA: Diagnosis not present

## 2019-11-14 DIAGNOSIS — C50212 Malignant neoplasm of upper-inner quadrant of left female breast: Secondary | ICD-10-CM | POA: Diagnosis not present

## 2019-11-18 DIAGNOSIS — L300 Nummular dermatitis: Secondary | ICD-10-CM | POA: Diagnosis not present

## 2019-11-18 DIAGNOSIS — L570 Actinic keratosis: Secondary | ICD-10-CM | POA: Diagnosis not present

## 2019-11-26 ENCOUNTER — Other Ambulatory Visit: Payer: Self-pay | Admitting: Gastroenterology

## 2019-11-26 DIAGNOSIS — I10 Essential (primary) hypertension: Secondary | ICD-10-CM | POA: Diagnosis not present

## 2019-11-26 DIAGNOSIS — E782 Mixed hyperlipidemia: Secondary | ICD-10-CM | POA: Diagnosis not present

## 2019-11-26 DIAGNOSIS — C50911 Malignant neoplasm of unspecified site of right female breast: Secondary | ICD-10-CM | POA: Diagnosis not present

## 2019-11-26 DIAGNOSIS — Z23 Encounter for immunization: Secondary | ICD-10-CM | POA: Diagnosis not present

## 2019-11-26 DIAGNOSIS — Z0000 Encounter for general adult medical examination without abnormal findings: Secondary | ICD-10-CM | POA: Diagnosis not present

## 2019-11-26 DIAGNOSIS — E039 Hypothyroidism, unspecified: Secondary | ICD-10-CM | POA: Diagnosis not present

## 2019-12-07 DIAGNOSIS — I10 Essential (primary) hypertension: Secondary | ICD-10-CM | POA: Diagnosis not present

## 2019-12-07 DIAGNOSIS — E782 Mixed hyperlipidemia: Secondary | ICD-10-CM | POA: Diagnosis not present

## 2019-12-22 DIAGNOSIS — Z23 Encounter for immunization: Secondary | ICD-10-CM | POA: Diagnosis not present

## 2019-12-23 DIAGNOSIS — L821 Other seborrheic keratosis: Secondary | ICD-10-CM | POA: Diagnosis not present

## 2020-01-06 DIAGNOSIS — E7849 Other hyperlipidemia: Secondary | ICD-10-CM | POA: Diagnosis not present

## 2020-01-06 DIAGNOSIS — I10 Essential (primary) hypertension: Secondary | ICD-10-CM | POA: Diagnosis not present

## 2020-01-08 DIAGNOSIS — H40052 Ocular hypertension, left eye: Secondary | ICD-10-CM | POA: Diagnosis not present

## 2020-01-16 ENCOUNTER — Other Ambulatory Visit: Payer: Self-pay | Admitting: Hematology and Oncology

## 2020-01-16 ENCOUNTER — Other Ambulatory Visit: Payer: Self-pay | Admitting: Pharmacist

## 2020-01-16 DIAGNOSIS — M8589 Other specified disorders of bone density and structure, multiple sites: Secondary | ICD-10-CM

## 2020-01-16 DIAGNOSIS — Z170 Estrogen receptor positive status [ER+]: Secondary | ICD-10-CM

## 2020-01-16 DIAGNOSIS — C50211 Malignant neoplasm of upper-inner quadrant of right female breast: Secondary | ICD-10-CM

## 2020-01-16 DIAGNOSIS — C50212 Malignant neoplasm of upper-inner quadrant of left female breast: Secondary | ICD-10-CM

## 2020-01-16 HISTORY — DX: Other specified disorders of bone density and structure, multiple sites: M85.89

## 2020-01-21 ENCOUNTER — Telehealth: Payer: Self-pay | Admitting: Oncology

## 2020-01-21 NOTE — Telephone Encounter (Signed)
Patient called to verify Jan 2022 Appt's 

## 2020-01-23 DIAGNOSIS — T8522XA Displacement of intraocular lens, initial encounter: Secondary | ICD-10-CM | POA: Diagnosis not present

## 2020-01-28 NOTE — Progress Notes (Signed)
PT STABLE AT TIME OF DISCHARGE 

## 2020-02-06 DIAGNOSIS — I700 Atherosclerosis of aorta: Secondary | ICD-10-CM | POA: Diagnosis not present

## 2020-02-06 DIAGNOSIS — I10 Essential (primary) hypertension: Secondary | ICD-10-CM | POA: Diagnosis not present

## 2020-02-06 DIAGNOSIS — I201 Angina pectoris with documented spasm: Secondary | ICD-10-CM | POA: Diagnosis not present

## 2020-02-06 DIAGNOSIS — E039 Hypothyroidism, unspecified: Secondary | ICD-10-CM | POA: Diagnosis not present

## 2020-02-09 ENCOUNTER — Ambulatory Visit: Payer: Medicare Other

## 2020-02-09 NOTE — Progress Notes (Signed)
[facility]  [address] [place],  [place]  [address] [phone]  Clinic Day:  02/11/2020  Referring physician: Venetia Maxon, Sharon Mt, *   This document serves as a record of services personally performed by Hosie Poisson, MD. It was created on their behalf by Curry,Lauren E, a trained medical scribe. The creation of this record is based on the scribe's personal observations and the provider's statements to them.   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  She was treated with lumpectomy.  Pathology revealed a 0.5 cm invasive ductal carcinoma in the background of high-grade ductal carcinoma in situ with a negative sentinel node.  Estrogen and progesterone receptors were positive and HER 2 negative.  Adjuvant chemotherapy was not recommended.  She did received postoperative radiation to the left breast and then was placed on anastrozole 1 mg daily in May 2012. She to not tolerate anastrozole due to malaise, so was switched to letrozole and did not tolerate that either.  Eventually she refused all hormonal therapy.  She underwent cholecystectomy in December 2016 and was in the hospital for 7 days.    We scheduled her for mammogram in February 2019, which revealed 2 suspicious masses in the right breast and biopsy was recommended.  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  The imaging appeared to be a 1.5 cm lesion.  Estrogen and progesterone receptors were highly positive with HER 2 negative, and a Ki 67 of 25%.  A second biopsy at 1 o'clock was benign, but felt to be discordant.  We had been  concerned about her memory.  She underwent right mastectomy in April 2019.  Pathology revealed a multicentric invasive ductal carcinoma with 5 negative nodes.  One lesion was grade 3 and 15 mm in size, and the other was grade 1 and 13 mm lesion in size, for a stage IA (T1c N0 M0) hormone receptor positive right breast cancer.  Bone density scan in June 2019 revealed worsening osteopenia of the femur for a T-score of -2.4 and stable osteopenia of the spine with a T-score of -1.9.  She was placed on adjuvant hormonal therapy with letrozole 2.5 mg in June 2019. She initially tolerated this fairly well, but later stopped it on her own.  In August 2019, she develops to small nodules of the right mastectomy site.  Biopsy of both of these in September 2019 revealed fat necrosis.  Bone density scan from June revealed osteoporosis with a T-score of -2.7 of the right femur, previously -2.2, and a T-score of-2.4 in the spine, previously -1.9.  Left screening mammogram in July did not reveal any evidence of malignancy. Due to the osteoporosis, we recommended that she be placed on Prolia every 6 months and she received her 1st dose on July 19th.  She has been on and off hormonal therapy with letrozole and anastrozole, but currently is compliant.    INTERVAL HISTORY:  [name] is here for routine follow up prior to her next Prolia.  She continues anastrozole daily without significant difficulty.  She states that she has been fairly well but notes arthralgias of the left  knee, especially the left.  She feels she needs a cortisone injection, and so we will refer her to Dr. Samule Dry.  Blood counts and chemistries are unremarkable except for a sodium of 133, improved, and a creatinine of 1.2, previously 0.9.  Her  appetite is good, and she has gained 2 pounds since her last visit.  She denies fever, chills or other signs of infection.  She denies nausea, vomiting, bowel issues, or abdominal pain.  She denies sore throat, cough,  dyspnea, or chest pain.  REVIEW OF SYSTEMS:  Review of Systems  Constitutional: Negative.   HENT:  Negative.   Eyes: Negative.   Respiratory: Negative.   Cardiovascular: Negative.   Gastrointestinal: Negative.   Endocrine: Negative.   Genitourinary: Negative.    Musculoskeletal: Positive for arthralgias (especially of the left knee).  Skin: Negative.   Neurological: Negative.   Hematological: Negative.   Psychiatric/Behavioral: Negative.      VITALS:  Blood pressure (!) 114/57, pulse 73, temperature 97.9 F (36.6 C), temperature source Oral, resp. rate 18, height 5\' 3"  (1.6 m), weight 149 lb 8 oz (67.8 kg), SpO2 95 %.  Wt Readings from Last 3 Encounters:  02/11/20 149 lb 8 oz (67.8 kg)  11/14/19 (!) 319 lb 6 oz (144.9 kg)  05/19/19 147 lb 6 oz (66.8 kg)    Body mass index is 26.48 kg/m.  Performance status (ECOG): 1 - Symptomatic but completely ambulatory  PHYSICAL EXAM:  Physical Exam Constitutional:      General: She is not in acute distress.    Appearance: Normal appearance. She is normal weight.  HENT:     Head: Normocephalic and atraumatic.  Eyes:     General: No scleral icterus.    Extraocular Movements: Extraocular movements intact.     Conjunctiva/sclera: Conjunctivae normal.     Pupils: Pupils are equal, round, and reactive to light.  Cardiovascular:     Rate and Rhythm: Normal rate and regular rhythm.     Pulses: Normal pulses.     Heart sounds: Normal heart sounds. No murmur heard. No friction rub. No gallop.   Pulmonary:     Effort: Pulmonary effort is normal. No respiratory distress.     Breath sounds: Normal breath sounds.  Chest:  Breasts:     Right: Absent.     Left: Normal.      Comments: Right mastectomy is negative.  She has a stable right axillary lipoma.  Left breast has a well healed scar in the upper inner quadrant, but no masses.   Abdominal:     General: Bowel sounds are normal. There is no distension.     Palpations: Abdomen is  soft. There is no mass.     Tenderness: There is no abdominal tenderness.  Musculoskeletal:        General: Normal range of motion.     Cervical back: Normal range of motion and neck supple.     Right lower leg: No edema.     Left lower leg: No edema.     Comments: She has crepitation of the left knee  Lymphadenopathy:     Cervical: No cervical adenopathy.  Skin:    General: Skin is warm and dry.  Neurological:     General: No focal deficit present.     Mental Status: She is alert and oriented to person, place, and time. Mental status is at baseline.  Psychiatric:        Mood and Affect: Mood normal.  Behavior: Behavior normal.        Thought Content: Thought content normal.        Judgment: Judgment normal.     LABS:  No flowsheet data found. No flowsheet data found.  STUDIES:  No results found.   Allergies:  Allergies  Allergen Reactions  . Cefdinir Nausea Only  . Hydrocodone-Acetaminophen Other (See Comments)  . Latuda [Lurasidone]   . Prednisone   . Codeine Other (See Comments) and Rash  . Penicillins Rash and Swelling  . Sulfa Antibiotics Rash    Current Medications: Current Outpatient Medications  Medication Sig Dispense Refill  . etodolac (LODINE) 400 MG tablet Take by mouth.    . ALPRAZolam (XANAX) 0.25 MG tablet Take 0.5-1 tablets by mouth as needed.     Marland Kitchen anastrozole (ARIMIDEX) 1 MG tablet Take 1 mg by mouth daily.    . carteolol (OCUPRESS) 1 % ophthalmic solution Place 1 drop into the left eye 2 (two) times daily.    . cetirizine HCl (ZYRTEC) 5 MG/5ML SOLN Take by mouth.    . citalopram (CELEXA) 20 MG tablet Take 20 mg by mouth daily.    Marland Kitchen co-enzyme Q-10 30 MG capsule Take by mouth.    . denosumab (PROLIA) 60 MG/ML SOSY injection Inject 60 mg into the skin every 6 (six) months.    . diphenhydrAMINE (SOMINEX) 25 MG tablet Take by mouth.    . famotidine (PEPCID) 40 MG tablet Take 1 tablet (40 mg total) by mouth at bedtime. Please call [phone]  to let us know how you are doing on this medication 30 tablet 1  . Fluoxetine HCl, PMDD, 10 MG TABS Take by mouth.    . gabapentin (NEURONTIN) 100 MG capsule Take 100 mg by mouth as needed.    Marland Kitchen levothyroxine (SYNTHROID) 75 MCG tablet Take 1 tablet by mouth daily.    Marland Kitchen lisinopril (ZESTRIL) 5 MG tablet Take by mouth.    Marland Kitchen lisinopril-hydrochlorothiazide (ZESTORETIC) 10-12.5 MG tablet Take 1 tablet by mouth daily.    Marland Kitchen loratadine (CLARITIN) 10 MG tablet Take 20 mg by mouth daily with breakfast.    . MAGNESIUM PO Take 2 tablets by mouth daily.    . metoprolol tartrate (LOPRESSOR) 25 MG tablet Take 12.5 mg by mouth 2 (two) times daily.    . Multiple Minerals-Vitamins (CALCIUM & VIT D3 BONE HEALTH PO) Take 2 tablets by mouth daily. Chewables    . nitroGLYCERIN (NITROSTAT) 0.4 MG SL tablet Place under the tongue.    . pantoprazole (PROTONIX) 40 MG tablet Take 1 tablet (40 mg total) by mouth 2 (two) times daily. 60 tablet 3   No current facility-administered medications for this visit.     ASSESSMENT & PLAN:   Assessment:   1. History of stage IA hormone receptor positive left breast cancer December 2011 treated with lumpectomy and postoperative radiation, but she was unable to tolerate hormonal therapy.    2. 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  She has been on and off adjuvant hormonal therapy, due to difficulty tolerating it.  She has resumed anastrozole for the last 6 months without difficulty.  3. Osteoporosis, for which she was started on Prolia every 6 months in July 2021.  4. Small soft right axillary lipoma.  5. Nodules of the right mastectomy incision.  Biopsies revealed fat necrosis.   6. 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  She is stable at this time.  7.  Significant arthritis, especially of the left knee.  We will refer her to Dr. Samule Dry.  Plan: She will proceed with Prolia on  January 7th, and again in 6 months.  She knows to continue anastrozole daily, and is tolerating this without difficulty at this time.  As requested, we will refer her to Dr. Samule Dry to discuss possible cortisone injection of the left knee.  We will see her back in 6 months with a CBC, comprehensive metabolic panel and unilateral left mammogram for repeat examination prior to her next Prolia.  The patient understands the plans discussed today and is in agreement with them.  She knows to contact our office if she develops concerns regarding her breast cancer or its treatment.   I provided 20 minutes of face-to-face time during this this encounter and > 50% was spent counseling as documented under my assessment and plan.    Derwood Kaplan, MD [facility] [address] [place] [address] Dept: [phone] Dept Fax: [phone]   I, Rita Ohara, am acting as scribe for Derwood Kaplan, MD  I have reviewed this report as typed by the medical scribe, and it is complete and accurate.

## 2020-02-11 ENCOUNTER — Encounter: Payer: Self-pay | Admitting: Oncology

## 2020-02-11 ENCOUNTER — Other Ambulatory Visit: Payer: Self-pay

## 2020-02-11 ENCOUNTER — Telehealth: Payer: Self-pay

## 2020-02-11 ENCOUNTER — Other Ambulatory Visit: Payer: Self-pay | Admitting: Oncology

## 2020-02-11 ENCOUNTER — Inpatient Hospital Stay: Payer: Medicare Other | Attending: Oncology

## 2020-02-11 ENCOUNTER — Inpatient Hospital Stay (INDEPENDENT_AMBULATORY_CARE_PROVIDER_SITE_OTHER): Payer: Medicare Other | Admitting: Oncology

## 2020-02-11 ENCOUNTER — Telehealth: Payer: Self-pay | Admitting: Oncology

## 2020-02-11 ENCOUNTER — Other Ambulatory Visit: Payer: Self-pay | Admitting: Hematology and Oncology

## 2020-02-11 VITALS — BP 114/57 | HR 73 | Temp 97.9°F | Resp 18 | Ht 63.0 in | Wt 149.5 lb

## 2020-02-11 DIAGNOSIS — Z923 Personal history of irradiation: Secondary | ICD-10-CM | POA: Insufficient documentation

## 2020-02-11 DIAGNOSIS — C50211 Malignant neoplasm of upper-inner quadrant of right female breast: Secondary | ICD-10-CM | POA: Diagnosis not present

## 2020-02-11 DIAGNOSIS — Z0001 Encounter for general adult medical examination with abnormal findings: Secondary | ICD-10-CM | POA: Diagnosis not present

## 2020-02-11 DIAGNOSIS — D649 Anemia, unspecified: Secondary | ICD-10-CM | POA: Diagnosis not present

## 2020-02-11 DIAGNOSIS — Z9011 Acquired absence of right breast and nipple: Secondary | ICD-10-CM | POA: Insufficient documentation

## 2020-02-11 DIAGNOSIS — Z170 Estrogen receptor positive status [ER+]: Secondary | ICD-10-CM | POA: Diagnosis not present

## 2020-02-11 DIAGNOSIS — M810 Age-related osteoporosis without current pathological fracture: Secondary | ICD-10-CM | POA: Insufficient documentation

## 2020-02-11 DIAGNOSIS — Z853 Personal history of malignant neoplasm of breast: Secondary | ICD-10-CM | POA: Insufficient documentation

## 2020-02-11 DIAGNOSIS — C50212 Malignant neoplasm of upper-inner quadrant of left female breast: Secondary | ICD-10-CM

## 2020-02-11 DIAGNOSIS — Z79899 Other long term (current) drug therapy: Secondary | ICD-10-CM | POA: Insufficient documentation

## 2020-02-11 LAB — CBC AND DIFFERENTIAL
HCT: 38 (ref 36–46)
Hemoglobin: 12.9 (ref 12.0–16.0)
Neutrophils Absolute: 4.47
Platelets: 382 (ref 150–399)
WBC: 7.1

## 2020-02-11 LAB — BASIC METABOLIC PANEL
BUN: 17 (ref 4–21)
CO2: 26 — AB (ref 13–22)
Chloride: 98 — AB (ref 99–108)
Creatinine: 1.2 — AB (ref 0.5–1.1)
Glucose: 109
Potassium: 4.1 (ref 3.4–5.3)
Sodium: 133 — AB (ref 137–147)

## 2020-02-11 LAB — HEPATIC FUNCTION PANEL
ALT: 18 (ref 7–35)
AST: 28 (ref 13–35)
Alkaline Phosphatase: 55 (ref 25–125)
Bilirubin, Total: 0.7

## 2020-02-11 LAB — COMPREHENSIVE METABOLIC PANEL
Albumin: 4.2 (ref 3.5–5.0)
Calcium: 9.4 (ref 8.7–10.7)

## 2020-02-11 LAB — CBC: RBC: 4.27 (ref 3.87–5.11)

## 2020-02-11 NOTE — Telephone Encounter (Signed)
Call Dr. Vivia Ewing office regarding referral for patient. The secretary states just fax referral and insurance information and they will call patient to get it set up. Faxed all requested information.

## 2020-02-11 NOTE — Telephone Encounter (Signed)
Per 1/5 los next appt given to patient 

## 2020-02-11 NOTE — Telephone Encounter (Signed)
-----   Message from Dellia Beckwith, MD sent at 02/11/2020 11:50 AM EST ----- Regarding: refer Pls ref Dr Nicki Guadalajara for arthritis of left knee

## 2020-02-12 DIAGNOSIS — C44629 Squamous cell carcinoma of skin of left upper limb, including shoulder: Secondary | ICD-10-CM | POA: Diagnosis not present

## 2020-02-13 ENCOUNTER — Other Ambulatory Visit: Payer: Self-pay

## 2020-02-13 ENCOUNTER — Inpatient Hospital Stay: Payer: Medicare Other

## 2020-02-13 VITALS — BP 124/60 | HR 70 | Temp 98.4°F | Resp 18 | Ht 63.0 in | Wt 149.4 lb

## 2020-02-13 DIAGNOSIS — Z923 Personal history of irradiation: Secondary | ICD-10-CM | POA: Diagnosis not present

## 2020-02-13 DIAGNOSIS — Z853 Personal history of malignant neoplasm of breast: Secondary | ICD-10-CM | POA: Diagnosis not present

## 2020-02-13 DIAGNOSIS — Z79899 Other long term (current) drug therapy: Secondary | ICD-10-CM | POA: Diagnosis not present

## 2020-02-13 DIAGNOSIS — Z9011 Acquired absence of right breast and nipple: Secondary | ICD-10-CM | POA: Diagnosis not present

## 2020-02-13 DIAGNOSIS — Z170 Estrogen receptor positive status [ER+]: Secondary | ICD-10-CM | POA: Diagnosis not present

## 2020-02-13 DIAGNOSIS — M810 Age-related osteoporosis without current pathological fracture: Secondary | ICD-10-CM | POA: Diagnosis not present

## 2020-02-13 DIAGNOSIS — M8589 Other specified disorders of bone density and structure, multiple sites: Secondary | ICD-10-CM

## 2020-02-13 DIAGNOSIS — C50211 Malignant neoplasm of upper-inner quadrant of right female breast: Secondary | ICD-10-CM | POA: Diagnosis not present

## 2020-02-13 MED ORDER — DENOSUMAB 60 MG/ML ~~LOC~~ SOSY
60.0000 mg | PREFILLED_SYRINGE | Freq: Once | SUBCUTANEOUS | Status: AC
  Administered 2020-02-13: 60 mg via SUBCUTANEOUS

## 2020-02-13 MED ORDER — DENOSUMAB 60 MG/ML ~~LOC~~ SOSY
PREFILLED_SYRINGE | SUBCUTANEOUS | Status: AC
  Filled 2020-02-13: qty 1

## 2020-02-13 NOTE — Addendum Note (Signed)
Addended by: Juanetta Beets on: 02/13/2020 12:17 PM   Modules accepted: Orders

## 2020-02-13 NOTE — Patient Instructions (Signed)
Denosumab injection What is this medicine? DENOSUMAB (den oh sue mab) slows bone breakdown. Prolia is used to treat osteoporosis in women after menopause and in men, and in people who are taking corticosteroids for 6 months or more. Xgeva is used to treat a high calcium level due to cancer and to prevent bone fractures and other bone problems caused by multiple myeloma or cancer bone metastases. Xgeva is also used to treat giant cell tumor of the bone. This medicine may be used for other purposes; ask your health care provider or pharmacist if you have questions. COMMON BRAND NAME(S): Prolia, XGEVA What should I tell my health care provider before I take this medicine? They need to know if you have any of these conditions:  dental disease  having surgery or tooth extraction  infection  kidney disease  low levels of calcium or Vitamin D in the blood  malnutrition  on hemodialysis  skin conditions or sensitivity  thyroid or parathyroid disease  an unusual reaction to denosumab, other medicines, foods, dyes, or preservatives  pregnant or trying to get pregnant  breast-feeding How should I use this medicine? This medicine is for injection under the skin. It is given by a health care professional in a hospital or clinic setting. A special MedGuide will be given to you before each treatment. Be sure to read this information carefully each time. For Prolia, talk to your pediatrician regarding the use of this medicine in children. Special care may be needed. For Xgeva, talk to your pediatrician regarding the use of this medicine in children. While this drug may be prescribed for children as young as 13 years for selected conditions, precautions do apply. Overdosage: If you think you have taken too much of this medicine contact a poison control center or emergency room at once. NOTE: This medicine is only for you. Do not share this medicine with others. What if I miss a dose? It is  important not to miss your dose. Call your doctor or health care professional if you are unable to keep an appointment. What may interact with this medicine? Do not take this medicine with any of the following medications:  other medicines containing denosumab This medicine may also interact with the following medications:  medicines that lower your chance of fighting infection  steroid medicines like prednisone or cortisone This list may not describe all possible interactions. Give your health care provider a list of all the medicines, herbs, non-prescription drugs, or dietary supplements you use. Also tell them if you smoke, drink alcohol, or use illegal drugs. Some items may interact with your medicine. What should I watch for while using this medicine? Visit your doctor or health care professional for regular checks on your progress. Your doctor or health care professional may order blood tests and other tests to see how you are doing. Call your doctor or health care professional for advice if you get a fever, chills or sore throat, or other symptoms of a cold or flu. Do not treat yourself. This drug may decrease your body's ability to fight infection. Try to avoid being around people who are sick. You should make sure you get enough calcium and vitamin D while you are taking this medicine, unless your doctor tells you not to. Discuss the foods you eat and the vitamins you take with your health care professional. See your dentist regularly. Brush and floss your teeth as directed. Before you have any dental work done, tell your dentist you are   receiving this medicine. Do not become pregnant while taking this medicine or for 5 months after stopping it. Talk with your doctor or health care professional about your birth control options while taking this medicine. Women should inform their doctor if they wish to become pregnant or think they might be pregnant. There is a potential for serious side  effects to an unborn child. Talk to your health care professional or pharmacist for more information. What side effects may I notice from receiving this medicine? Side effects that you should report to your doctor or health care professional as soon as possible:  allergic reactions like skin rash, itching or hives, swelling of the face, lips, or tongue  bone pain  breathing problems  dizziness  jaw pain, especially after dental work  redness, blistering, peeling of the skin  signs and symptoms of infection like fever or chills; cough; sore throat; pain or trouble passing urine  signs of low calcium like fast heartbeat, muscle cramps or muscle pain; pain, tingling, numbness in the hands or feet; seizures  unusual bleeding or bruising  unusually weak or tired Side effects that usually do not require medical attention (report to your doctor or health care professional if they continue or are bothersome):  constipation  diarrhea  headache  joint pain  loss of appetite  muscle pain  runny nose  tiredness  upset stomach This list may not describe all possible side effects. Call your doctor for medical advice about side effects. You may report side effects to FDA at 1-800-FDA-1088. Where should I keep my medicine? This medicine is only given in a clinic, doctor's office, or other health care setting and will not be stored at home. NOTE: This sheet is a summary. It may not cover all possible information. If you have questions about this medicine, talk to your doctor, pharmacist, or health care provider.  2020 Elsevier/Gold Standard (2017-06-01 16:10:44)

## 2020-02-16 DIAGNOSIS — M1712 Unilateral primary osteoarthritis, left knee: Secondary | ICD-10-CM | POA: Diagnosis not present

## 2020-02-19 DIAGNOSIS — C44629 Squamous cell carcinoma of skin of left upper limb, including shoulder: Secondary | ICD-10-CM | POA: Diagnosis not present

## 2020-02-23 ENCOUNTER — Encounter: Payer: Self-pay | Admitting: Oncology

## 2020-02-23 DIAGNOSIS — M810 Age-related osteoporosis without current pathological fracture: Secondary | ICD-10-CM

## 2020-02-23 HISTORY — DX: Age-related osteoporosis without current pathological fracture: M81.0

## 2020-03-07 DIAGNOSIS — I10 Essential (primary) hypertension: Secondary | ICD-10-CM | POA: Diagnosis not present

## 2020-03-07 DIAGNOSIS — E782 Mixed hyperlipidemia: Secondary | ICD-10-CM | POA: Diagnosis not present

## 2020-03-08 DIAGNOSIS — J340 Abscess, furuncle and carbuncle of nose: Secondary | ICD-10-CM | POA: Diagnosis not present

## 2020-03-08 DIAGNOSIS — R252 Cramp and spasm: Secondary | ICD-10-CM | POA: Diagnosis not present

## 2020-03-08 DIAGNOSIS — I700 Atherosclerosis of aorta: Secondary | ICD-10-CM | POA: Diagnosis not present

## 2020-03-08 DIAGNOSIS — I201 Angina pectoris with documented spasm: Secondary | ICD-10-CM | POA: Diagnosis not present

## 2020-03-08 DIAGNOSIS — G720 Drug-induced myopathy: Secondary | ICD-10-CM | POA: Diagnosis not present

## 2020-03-08 DIAGNOSIS — E782 Mixed hyperlipidemia: Secondary | ICD-10-CM | POA: Diagnosis not present

## 2020-03-08 DIAGNOSIS — I70213 Atherosclerosis of native arteries of extremities with intermittent claudication, bilateral legs: Secondary | ICD-10-CM | POA: Diagnosis not present

## 2020-03-08 DIAGNOSIS — C50911 Malignant neoplasm of unspecified site of right female breast: Secondary | ICD-10-CM | POA: Diagnosis not present

## 2020-03-08 DIAGNOSIS — K219 Gastro-esophageal reflux disease without esophagitis: Secondary | ICD-10-CM | POA: Diagnosis not present

## 2020-03-16 DIAGNOSIS — H40052 Ocular hypertension, left eye: Secondary | ICD-10-CM | POA: Diagnosis not present

## 2020-03-18 DIAGNOSIS — K219 Gastro-esophageal reflux disease without esophagitis: Secondary | ICD-10-CM | POA: Diagnosis not present

## 2020-03-18 DIAGNOSIS — R1013 Epigastric pain: Secondary | ICD-10-CM | POA: Diagnosis not present

## 2020-03-22 ENCOUNTER — Other Ambulatory Visit: Payer: Self-pay | Admitting: Hematology and Oncology

## 2020-03-22 MED ORDER — ANASTROZOLE 1 MG PO TABS: 1.0000 mg | ORAL_TABLET | Freq: Every day | ORAL | 3 refills | Status: DC

## 2020-03-25 ENCOUNTER — Telehealth: Payer: Self-pay

## 2020-03-25 DIAGNOSIS — R1013 Epigastric pain: Secondary | ICD-10-CM | POA: Diagnosis not present

## 2020-03-25 NOTE — Telephone Encounter (Signed)
Patient called left message to call her. Called patient back. She wants to who Dr. Hinton Rao recommends her seeing for her stomach. Had U/S done today of the liver and abdomen. Have not got the results back. Has been seeing PCP but not results yet.

## 2020-03-25 NOTE — Telephone Encounter (Signed)
Patient wants to know who you would recommend her seeing for her stomach issues. Trying to get a head start on trying to see someone. Patients phone number: [phone]. Can leave message if she doesn't answer.

## 2020-03-26 ENCOUNTER — Telehealth: Payer: Self-pay

## 2020-03-26 NOTE — Telephone Encounter (Signed)
-----   Message from Derwood Kaplan, MD sent at 03/25/2020  4:48 PM EST ----- Regarding: GI She saw Dr. Lyndel Safe last year but if doesn't want to travel that far, can ref Dr. Lyda Jester

## 2020-03-26 NOTE — Telephone Encounter (Signed)
Patient states her PCP is referring her to Dr. Lyda Jester.

## 2020-03-30 DIAGNOSIS — H40052 Ocular hypertension, left eye: Secondary | ICD-10-CM | POA: Diagnosis not present

## 2020-04-05 DIAGNOSIS — I10 Essential (primary) hypertension: Secondary | ICD-10-CM | POA: Diagnosis not present

## 2020-04-05 DIAGNOSIS — E782 Mixed hyperlipidemia: Secondary | ICD-10-CM | POA: Diagnosis not present

## 2020-04-07 DIAGNOSIS — M1991 Primary osteoarthritis, unspecified site: Secondary | ICD-10-CM | POA: Diagnosis not present

## 2020-04-07 DIAGNOSIS — I70213 Atherosclerosis of native arteries of extremities with intermittent claudication, bilateral legs: Secondary | ICD-10-CM | POA: Diagnosis not present

## 2020-04-08 ENCOUNTER — Telehealth: Payer: Self-pay

## 2020-04-08 ENCOUNTER — Other Ambulatory Visit: Payer: Self-pay | Admitting: Oncology

## 2020-04-08 NOTE — Telephone Encounter (Signed)
-----   Message from Derwood Kaplan, MD sent at 04/08/2020  1:49 PM EST ----- Regarding: referral If she wants referral to Dr. Lyndel Safe, I need to know why.  She did not have any GI complaints in Jan.

## 2020-04-08 NOTE — Telephone Encounter (Signed)
For Colonoscopy and Upper endoscopy. Has appointment with Dr. Lyda Jester and does not want to go to him. Had U/S that showed spotty liver and it recommended an Colonoscopy and Upper Endoscopy.

## 2020-04-08 NOTE — Telephone Encounter (Signed)
Patient called waiting to know if Dr. Hinton Welch would do a referral to Dr. Lyndel Welch for her. Is it ok to send a referral to Dr. Lyndel Welch?

## 2020-04-08 NOTE — Telephone Encounter (Signed)
Patient states she was scheduled to Dr. Lyda Jester and she does not want to see him for Colonoscopy and Upper Endoscopy to follow up on U/s of ABD that showed issues with liver.She said she would rather see Dr. Lyndel Safe.

## 2020-04-13 DIAGNOSIS — H40052 Ocular hypertension, left eye: Secondary | ICD-10-CM | POA: Diagnosis not present

## 2020-04-22 ENCOUNTER — Other Ambulatory Visit: Payer: Self-pay

## 2020-04-22 ENCOUNTER — Encounter: Payer: Self-pay | Admitting: Gastroenterology

## 2020-04-22 ENCOUNTER — Ambulatory Visit: Payer: Medicare Other | Admitting: Gastroenterology

## 2020-04-22 VITALS — BP 136/70 | HR 80 | Ht 62.75 in | Wt 148.4 lb

## 2020-04-22 DIAGNOSIS — R194 Change in bowel habit: Secondary | ICD-10-CM

## 2020-04-22 DIAGNOSIS — R1084 Generalized abdominal pain: Secondary | ICD-10-CM | POA: Diagnosis not present

## 2020-04-22 DIAGNOSIS — R140 Abdominal distension (gaseous): Secondary | ICD-10-CM

## 2020-04-22 NOTE — Progress Notes (Signed)
04/22/2020 Holly Welch [id number] 12-11-1940   HISTORY OF PRESENT ILLNESS: This is an 80 year old female is a patient of Dr. Steve Rattler.  She is here today at the request of her PCP, Dr. Venetia Maxon, for evaluation regarding her complaints of abdominal pain/bloating/gas.  She reports generalized abdominal discomfort/pain.  She says that she gets very painful gas.  Says that her abdomen gets bloated.  Reports constipation alternating with diarrhea.  She says that she had an ultrasound performed that showed fatty liver.  She said that her family doctor wanted her to come here to talk about possible EGD and colonoscopy, but she really does not want to have those performed unless absolutely necessary.  She was prescribed Carafate and has been taking that twice a day, which she thinks helps a lot with the gas.  She is also on pantoprazole 40 mg twice daily.  EGD in April 2013 showed a hiatal hernia and incidental gastric polyps, fundic gland polyps on pathology.  Small bowel biopsies were normal.  Last colonoscopy was probably around 2013 as well.   Past Medical History:  Diagnosis Date  . Anxiety and depression   . Chronic constipation   . Detached retina   . GERD (gastroesophageal reflux disease)   . History of left breast cancer   . Hypothyroid   . Malignant neoplasm of upper-inner quadrant of left female breast [facility])    Past Surgical History:  Procedure Laterality Date  . APPENDECTOMY    . BREAST LUMPECTOMY    . CHOLECYSTECTOMY    . COLONOSCOPY  07/05/2011   Mild sigmoid diverticulosis. Small internal hemorrhoids. Otherwise normal colonoscopy.   . ESOPHAGOGASTRODUODENOSCOPY  05/10/2011   Hiatal hernia. Incidental gastric polyp.   Marland Kitchen MASTECTOMY     partial on left 8 years ago and right was 2 years ago  . THYROIDECTOMY      reports that she has never smoked. She has never used smokeless tobacco. She reports previous alcohol use. She reports that she does not use drugs. family  history includes Breast cancer in her paternal aunt. Allergies  Allergen Reactions  . Cefdinir Nausea Only  . Hydrocodone-Acetaminophen Other (See Comments)  . Latuda [Lurasidone]   . Prednisone   . Codeine Other (See Comments) and Rash  . Penicillins Rash and Swelling  . Sulfa Antibiotics Rash      Outpatient Encounter Medications as of 04/22/2020  Medication Sig  . ALPRAZolam (XANAX) 0.25 MG tablet Take 0.5-1 tablets by mouth as needed.   Marland Kitchen anastrozole (ARIMIDEX) 1 MG tablet Take 1 tablet (1 mg total) by mouth daily.  . calcium-vitamin D (OSCAL WITH D) 250-125 MG-UNIT tablet Take 1 tablet by mouth daily. PT STATES SHE IS USING A CALCIUM CHEWABLE GUMMY.  I INSTRUCTED HER TO BE SURE TO GET 1200 MG TOTAL DAILY.  SHE SAYS SHE WILL CHECK DOSAGE ON BOTTLE.  . carteolol (OCUPRESS) 1 % ophthalmic solution Place 1 drop into the left eye 2 (two) times daily.  . cetirizine HCl (ZYRTEC) 5 MG/5ML SOLN Take by mouth.  . citalopram (CELEXA) 20 MG tablet Take 20 mg by mouth daily.  Marland Kitchen co-enzyme Q-10 30 MG capsule Take by mouth.  . denosumab (PROLIA) 60 MG/ML SOSY injection Inject 60 mg into the skin every 6 (six) months.  . diphenhydrAMINE (SOMINEX) 25 MG tablet Take by mouth.  . dorzolamide (TRUSOPT) 2 % ophthalmic solution Place 1 drop into the left eye 2 (two) times daily.  Marland Kitchen etodolac (LODINE) 400 MG tablet  Take by mouth.  . famotidine (PEPCID) 40 MG tablet Take 1 tablet (40 mg total) by mouth at bedtime. Please call [phone] to let us know how you are doing on this medication  . Fluoxetine HCl, PMDD, 10 MG TABS Take by mouth.  . gabapentin (NEURONTIN) 100 MG capsule Take 100 mg by mouth as needed.  Marland Kitchen levothyroxine (SYNTHROID) 75 MCG tablet Take 1 tablet by mouth daily.  Marland Kitchen lisinopril (ZESTRIL) 5 MG tablet Take by mouth.  Marland Kitchen lisinopril-hydrochlorothiazide (ZESTORETIC) 10-12.5 MG tablet Take 1 tablet by mouth daily.  Marland Kitchen loratadine (CLARITIN) 10 MG tablet Take 20 mg by mouth daily with breakfast.   . MAGNESIUM PO Take 2 tablets by mouth daily.  . metoprolol tartrate (LOPRESSOR) 25 MG tablet Take 12.5 mg by mouth 2 (two) times daily.  . Multiple Minerals-Vitamins (CALCIUM & VIT D3 BONE HEALTH PO) Take 2 tablets by mouth daily. Chewables  . mupirocin ointment (BACTROBAN) 2 % Place into the nose 2 (two) times daily.  . nitroGLYCERIN (NITROSTAT) 0.4 MG SL tablet Place under the tongue.  . pantoprazole (PROTONIX) 40 MG tablet Take 1 tablet (40 mg total) by mouth 2 (two) times daily.  . potassium chloride SA (KLOR-CON) 20 MEQ tablet Take 20 mEq by mouth daily.  . sucralfate (CARAFATE) 1 g tablet Take 1 g by mouth 2 (two) times daily.  Marland Kitchen triamcinolone (KENALOG) 0.1 % Apply topically 2 (two) times daily as needed.   No facility-administered encounter medications on file as of 04/22/2020.     REVIEW OF SYSTEMS  : All other systems reviewed and negative except where noted in the History of Present Illness.   PHYSICAL EXAM: BP 136/70 (BP Location: Left Arm, Patient Position: Sitting, Cuff Size: Normal)   Pulse 80   Ht 5' 2.75" (1.594 m) Comment: height measured without shoes  Wt 148 lb 6 oz (67.3 kg)   BMI 26.49 kg/m  General: Well developed white female in no acute distress Head: Normocephalic and atraumatic Eyes:  Sclerae anicteric, conjunctiva pink. Ears: Normal auditory acuity Lungs: Clear throughout to auscultation; no W/R/R. Heart: Regular rate and rhythm; no M/R/G. Abdomen: Soft, non-distended.  BS present.  Mild diffuse TTP. Musculoskeletal: Symmetrical with no gross deformities  Skin: No lesions on visible extremities Extremities: No edema  Neurological: Alert oriented x 4, grossly non-focal Psychological:  Alert and cooperative. Normal mood and affect  ASSESSMENT AND PLAN: *80 year old female with complaints of generalized abdominal pain, bloating, altered bowel habits (changes between constipation and diarrhea).  She says that she prefers not to have endoscopic evaluation  unless absolutely necessary.  We will plan for CT scan of the abdomen and pelvis with contrast first.  I have asked her to begin taking a daily powder fiber supplement such as Benefiber beginning with 2 teaspoons mixed in 8 ounces of liquid daily and increasing to twice daily if tolerated or if needed.   CC:  [address], [place], [place]

## 2020-04-22 NOTE — Patient Instructions (Addendum)
If you are age 82 or older, your body mass index should be between 23-30. Your Body mass index is 26.49 kg/m. If this is out of the aforementioned range listed, please consider follow up with your Primary Care Provider.  If you are age 15 or younger, your body mass index should be between 19-25. Your Body mass index is 26.49 kg/m. If this is out of the aformentioned range listed, please consider follow up with your Primary Care Provider.   Start Benefiber or Citrucel 2 teaspoons in 8 ounces of liquid daily and may increase to twice daily if tolerated.   You have been scheduled for a CT scan of the abdomen and pelvis at [facility], 1st floor Radiology. You are scheduled on 04/29/20  at 11:30am. You should arrive 15 minutes prior to your appointment time for registration.  Please pick up 2 bottles of contrast from [place] at least 3 days prior to your scan. The solution may taste better if refrigerated, but do NOT add ice or any other liquid to this solution. Shake well before drinking.   Please follow the written instructions below on the day of your exam:   1) Do not eat anything after 7:30am (4 hours prior to your test)   2) Drink 1 bottle of contrast @ 9:30am (2 hours prior to your exam)  Remember to shake well before drinking and do NOT pour over ice.     Drink 1 bottle of contrast @ 10:30am (1 hour prior to your exam)   You may take any medications as prescribed with a small amount of water, if necessary. If you take any of the following medications: METFORMIN, GLUCOPHAGE, GLUCOVANCE, AVANDAMET, RIOMET, FORTAMET, [place] MET, JANUMET, GLUMETZA or METAGLIP, you MAY be asked to HOLD this medication 48 hours AFTER the exam.   The purpose of you drinking the oral contrast is to aid in the visualization of your intestinal tract. The contrast solution may cause some diarrhea. Depending on your individual set of symptoms, you may also receive an intravenous injection of x-ray  contrast/dye. Plan on being at [facility] for 45 minutes or longer, depending on the type of exam you are having performed.   If you have any questions regarding your exam or if you need to reschedule, you may call [facility] at [phone] between the hours of 8:00 am and 5:00 pm, Monday-Friday.   Thank you for choosing me and [facility].  Alonza Bogus, PA-C

## 2020-04-26 ENCOUNTER — Encounter: Payer: Self-pay | Admitting: Gastroenterology

## 2020-04-26 DIAGNOSIS — R1084 Generalized abdominal pain: Secondary | ICD-10-CM

## 2020-04-26 DIAGNOSIS — R140 Abdominal distension (gaseous): Secondary | ICD-10-CM

## 2020-04-26 DIAGNOSIS — R194 Change in bowel habit: Secondary | ICD-10-CM

## 2020-04-26 HISTORY — DX: Generalized abdominal pain: R10.84

## 2020-04-26 HISTORY — DX: Change in bowel habit: R19.4

## 2020-04-26 HISTORY — DX: Abdominal distension (gaseous): R14.0

## 2020-04-29 ENCOUNTER — Encounter (HOSPITAL_COMMUNITY): Payer: Self-pay

## 2020-04-29 ENCOUNTER — Other Ambulatory Visit: Payer: Self-pay

## 2020-04-29 ENCOUNTER — Ambulatory Visit (HOSPITAL_COMMUNITY)
Admission: RE | Admit: 2020-04-29 | Discharge: 2020-04-29 | Disposition: A | Discharge status: Home / Self Care | Payer: Medicare Other | Source: Ambulatory Visit | Attending: Gastroenterology | Admitting: Gastroenterology

## 2020-04-29 DIAGNOSIS — R109 Unspecified abdominal pain: Secondary | ICD-10-CM | POA: Diagnosis not present

## 2020-04-29 DIAGNOSIS — R194 Change in bowel habit: Secondary | ICD-10-CM | POA: Diagnosis not present

## 2020-04-29 DIAGNOSIS — R1084 Generalized abdominal pain: Secondary | ICD-10-CM | POA: Diagnosis not present

## 2020-04-29 DIAGNOSIS — R140 Abdominal distension (gaseous): Secondary | ICD-10-CM | POA: Diagnosis not present

## 2020-04-29 LAB — POCT I-STAT CREATININE: Creatinine, Ser: 0.9 mg/dL (ref 0.44–1.00)

## 2020-04-29 MED ORDER — IOHEXOL 300 MG/ML  SOLN
100.0000 mL | Freq: Once | INTRAMUSCULAR | Status: AC | PRN
  Administered 2020-04-29: 100 mL via INTRAVENOUS

## 2020-05-05 DIAGNOSIS — E782 Mixed hyperlipidemia: Secondary | ICD-10-CM | POA: Diagnosis not present

## 2020-05-05 DIAGNOSIS — I10 Essential (primary) hypertension: Secondary | ICD-10-CM | POA: Diagnosis not present

## 2020-05-14 DIAGNOSIS — R0781 Pleurodynia: Secondary | ICD-10-CM | POA: Diagnosis not present

## 2020-05-17 DIAGNOSIS — R0781 Pleurodynia: Secondary | ICD-10-CM | POA: Diagnosis not present

## 2020-05-19 DIAGNOSIS — Z9181 History of falling: Secondary | ICD-10-CM | POA: Diagnosis not present

## 2020-05-19 DIAGNOSIS — S27321D Contusion of lung, unilateral, subsequent encounter: Secondary | ICD-10-CM | POA: Diagnosis not present

## 2020-05-19 DIAGNOSIS — M1991 Primary osteoarthritis, unspecified site: Secondary | ICD-10-CM | POA: Diagnosis not present

## 2020-05-19 DIAGNOSIS — S20212D Contusion of left front wall of thorax, subsequent encounter: Secondary | ICD-10-CM | POA: Diagnosis not present

## 2020-05-19 DIAGNOSIS — M810 Age-related osteoporosis without current pathological fracture: Secondary | ICD-10-CM | POA: Diagnosis not present

## 2020-05-19 DIAGNOSIS — M4727 Other spondylosis with radiculopathy, lumbosacral region: Secondary | ICD-10-CM | POA: Diagnosis not present

## 2020-05-19 DIAGNOSIS — I10 Essential (primary) hypertension: Secondary | ICD-10-CM | POA: Diagnosis not present

## 2020-05-21 NOTE — Progress Notes (Signed)
Agree with excellent plan. RG 

## 2020-05-26 DIAGNOSIS — R0989 Other specified symptoms and signs involving the circulatory and respiratory systems: Secondary | ICD-10-CM | POA: Diagnosis not present

## 2020-05-26 DIAGNOSIS — R7301 Impaired fasting glucose: Secondary | ICD-10-CM | POA: Diagnosis not present

## 2020-05-26 DIAGNOSIS — E039 Hypothyroidism, unspecified: Secondary | ICD-10-CM | POA: Diagnosis not present

## 2020-05-26 DIAGNOSIS — Z79899 Other long term (current) drug therapy: Secondary | ICD-10-CM | POA: Diagnosis not present

## 2020-05-26 DIAGNOSIS — G720 Drug-induced myopathy: Secondary | ICD-10-CM | POA: Diagnosis not present

## 2020-05-26 DIAGNOSIS — I10 Essential (primary) hypertension: Secondary | ICD-10-CM | POA: Diagnosis not present

## 2020-05-26 DIAGNOSIS — R404 Transient alteration of awareness: Secondary | ICD-10-CM | POA: Diagnosis not present

## 2020-05-26 DIAGNOSIS — E782 Mixed hyperlipidemia: Secondary | ICD-10-CM | POA: Diagnosis not present

## 2020-05-26 DIAGNOSIS — R29818 Other symptoms and signs involving the nervous system: Secondary | ICD-10-CM | POA: Diagnosis not present

## 2020-05-28 DIAGNOSIS — R29818 Other symptoms and signs involving the nervous system: Secondary | ICD-10-CM | POA: Diagnosis not present

## 2020-05-28 DIAGNOSIS — I639 Cerebral infarction, unspecified: Secondary | ICD-10-CM | POA: Diagnosis not present

## 2020-05-28 DIAGNOSIS — H4720 Unspecified optic atrophy: Secondary | ICD-10-CM | POA: Diagnosis not present

## 2020-05-28 DIAGNOSIS — G319 Degenerative disease of nervous system, unspecified: Secondary | ICD-10-CM | POA: Diagnosis not present

## 2020-05-28 DIAGNOSIS — I672 Cerebral atherosclerosis: Secondary | ICD-10-CM | POA: Diagnosis not present

## 2020-06-03 DIAGNOSIS — E871 Hypo-osmolality and hyponatremia: Secondary | ICD-10-CM | POA: Diagnosis not present

## 2020-06-05 DIAGNOSIS — I10 Essential (primary) hypertension: Secondary | ICD-10-CM | POA: Diagnosis not present

## 2020-06-05 DIAGNOSIS — E782 Mixed hyperlipidemia: Secondary | ICD-10-CM | POA: Diagnosis not present

## 2020-06-07 ENCOUNTER — Telehealth: Payer: Self-pay | Admitting: Oncology

## 2020-06-09 DIAGNOSIS — E871 Hypo-osmolality and hyponatremia: Secondary | ICD-10-CM | POA: Diagnosis not present

## 2020-06-17 DIAGNOSIS — I10 Essential (primary) hypertension: Secondary | ICD-10-CM | POA: Diagnosis not present

## 2020-06-22 DIAGNOSIS — C44629 Squamous cell carcinoma of skin of left upper limb, including shoulder: Secondary | ICD-10-CM | POA: Diagnosis not present

## 2020-06-22 DIAGNOSIS — L821 Other seborrheic keratosis: Secondary | ICD-10-CM | POA: Diagnosis not present

## 2020-06-22 DIAGNOSIS — L578 Other skin changes due to chronic exposure to nonionizing radiation: Secondary | ICD-10-CM | POA: Diagnosis not present

## 2020-06-29 DIAGNOSIS — H40052 Ocular hypertension, left eye: Secondary | ICD-10-CM | POA: Diagnosis not present

## 2020-07-05 DIAGNOSIS — I10 Essential (primary) hypertension: Secondary | ICD-10-CM | POA: Diagnosis not present

## 2020-07-05 DIAGNOSIS — E782 Mixed hyperlipidemia: Secondary | ICD-10-CM | POA: Diagnosis not present

## 2020-07-27 DIAGNOSIS — H40052 Ocular hypertension, left eye: Secondary | ICD-10-CM | POA: Diagnosis not present

## 2020-08-02 DIAGNOSIS — I10 Essential (primary) hypertension: Secondary | ICD-10-CM | POA: Diagnosis not present

## 2020-08-02 DIAGNOSIS — E039 Hypothyroidism, unspecified: Secondary | ICD-10-CM | POA: Diagnosis not present

## 2020-08-05 DIAGNOSIS — E782 Mixed hyperlipidemia: Secondary | ICD-10-CM | POA: Diagnosis not present

## 2020-08-05 DIAGNOSIS — I10 Essential (primary) hypertension: Secondary | ICD-10-CM | POA: Diagnosis not present

## 2020-08-13 DIAGNOSIS — Z1231 Encounter for screening mammogram for malignant neoplasm of breast: Secondary | ICD-10-CM | POA: Diagnosis not present

## 2020-08-15 NOTE — Progress Notes (Signed)
[facility]  [address] [place],  [place]  [address] [phone]  Clinic Day:  08/15/2020  Referring physician: Venetia Maxon, Sharon Mt, *   This document serves as a record of services personally performed by Hosie Poisson, MD. It was created on their behalf by Curry,Lauren E, a trained medical scribe. The creation of this record is based on the scribe's personal observations and the provider's statements to them.   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  She was treated with lumpectomy.  Pathology revealed a 0.5 cm invasive ductal carcinoma in the background of high-grade ductal carcinoma in situ with a negative sentinel node.  Estrogen and progesterone receptors were positive and HER 2 negative.  Adjuvant chemotherapy was not recommended.  She did received postoperative radiation to the left breast and then was placed on anastrozole 1 mg daily in May 2012. She to not tolerate anastrozole due to malaise, so was switched to letrozole and did not tolerate that either.  Eventually she refused all hormonal therapy.  She underwent cholecystectomy in December 2016 and was in the hospital for 7 days.    We scheduled her for mammogram in February 2019, which revealed 2 suspicious masses in the right breast and biopsy was recommended.  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  The imaging appeared to be a 1.5 cm lesion.  Estrogen and progesterone receptors were highly positive with HER 2 negative, and a Ki 67 of 25%.  A second biopsy at 1 o'clock was benign, but felt to be discordant.  We had been  concerned about her memory.  She underwent right mastectomy in April 2019.  Pathology revealed a multicentric invasive ductal carcinoma with 5 negative nodes.  One lesion was grade 3 and 15 mm in size, and the other was grade 1 and 13 mm lesion in size, for a stage IA (T1c N0 M0) hormone receptor positive right breast cancer.  Bone density scan in June 2019 revealed worsening osteopenia of the femur for a T-score of -2.4 and stable osteopenia of the spine with a T-score of -1.9.  She was placed on adjuvant hormonal therapy with letrozole 2.5 mg in June 2019. She initially tolerated this fairly well, but later stopped it on her own.  In August 2019, she develops to small nodules of the right mastectomy site.  Biopsy of both of these in September 2019 revealed fat necrosis.  Bone density scan from June revealed osteoporosis with a T-score of -2.7 of the right femur, previously -2.2, and a T-score of-2.4 in the spine, previously -1.9.  Left screening mammogram in July did not reveal any evidence of malignancy. Due to the osteoporosis, we recommended that she be placed on Prolia every 6 months and she received her 1st dose on July 19th.  She has been on and off hormonal therapy with letrozole and anastrozole, but currently is compliant.    INTERVAL HISTORY:  Holly Welch is here for routine follow up prior to her next Prolia.  However she declines to continue this for now.  She has had 2 doses and complains of right jaw pain and cramping.  She continues anastrozole daily without significant difficulty.  She had her annual left mammogram on 7/8 and this was clear. She states that she has been fairly well now but experienced an episode of syncope and was evaluated with finding of hyponatremia.  The brain CT showed only mild atrophy.  Blood counts and chemistries are unremarkable except for a sodium of 133, stable.  Her  appetite is good, and she has lost 1 1/2 pounds since her last visit.  She denies fever, chills or other  signs of infection.  She denies nausea, vomiting, bowel issues, or abdominal pain.  She denies sore throat, cough, dyspnea, or chest pain.  REVIEW OF SYSTEMS:  Review of Systems  Constitutional: Negative.   HENT:  Negative.    Eyes: Negative.   Respiratory: Negative.    Cardiovascular: Negative.   Gastrointestinal: Negative.   Endocrine: Negative.   Genitourinary: Negative.    Musculoskeletal:  Positive for arthralgias (especially of the left knee).  Skin: Negative.   Neurological: Negative.   Hematological: Negative.   Psychiatric/Behavioral: Negative.      VITALS:  There were no vitals taken for this visit.  Wt Readings from Last 3 Encounters:  04/22/20 148 lb 6 oz (67.3 kg)  02/13/20 149 lb 7 oz (67.8 kg)  02/11/20 149 lb 8 oz (67.8 kg)    There is no height or weight on file to calculate BMI.  Performance status (ECOG): 1 - Symptomatic but completely ambulatory  PHYSICAL EXAM:  Physical Exam Constitutional:      General: She is not in acute distress.    Appearance: Normal appearance. She is normal weight.  HENT:     Head: Normocephalic and atraumatic.  Eyes:     General: No scleral icterus.    Extraocular Movements: Extraocular movements intact.     Conjunctiva/sclera: Conjunctivae normal.     Pupils: Pupils are equal, round, and reactive to light.  Cardiovascular:     Rate and Rhythm: Normal rate and regular rhythm.     Pulses: Normal pulses.     Heart sounds: Normal heart sounds. No murmur heard.   No friction rub. No gallop.  Pulmonary:     Effort: Pulmonary effort is normal. No respiratory distress.     Breath sounds: Normal breath sounds.  Chest:  Breasts:    Right: Absent.     Left: Normal.     Comments: Right mastectomy is negative.  She has a stable right axillary lipoma.  Left breast has a well healed scar in the upper inner quadrant, but no masses.   Abdominal:     General: Bowel sounds are normal. There is no distension.     Palpations: Abdomen  is soft. There is no mass.     Tenderness: There is no abdominal tenderness.  Musculoskeletal:        General: Normal range of motion.     Cervical back: Normal range of motion and neck supple.     Right lower leg: No edema.     Left lower leg: No edema.     Comments: She has crepitation of the left knee  Lymphadenopathy:     Cervical: No cervical adenopathy.  Skin:    General: Skin is warm and dry.  Neurological:     General: No focal deficit present.     Mental Status: She is alert and oriented to person, place, and time. Mental status is at baseline.  Psychiatric:        Mood and  Affect: Mood normal.        Behavior: Behavior normal.        Thought Content: Thought content normal.        Judgment: Judgment normal.    LABS:   CBC Latest Ref Rng & Units 02/11/2020  WBC - 7.1  Hemoglobin 12.0 - 16.0 12.9  Hematocrit 36 - 46 38  Platelets 150 - 399 382   CMP Latest Ref Rng & Units 04/29/2020 02/11/2020  BUN 4 - 21 - 17  Creatinine 0.44 - 1.00 mg/dL 0.90 1.2(A)  Sodium 137 - 147 - 133(A)  Potassium 3.4 - 5.3 - 4.1  Chloride 99 - 108 - 98(A)  CO2 13 - 22 - 26(A)  Calcium 8.7 - 10.7 - 9.4  Alkaline Phos 25 - 125 - 55  AST 13 - 35 - 28  ALT 7 - 35 - 18    STUDIES:  Annual left mammogram was clear 08/13/20.  Allergies:  Allergies  Allergen Reactions   Cefdinir Nausea Only   Hydrocodone-Acetaminophen Other (See Comments)   Latuda [Lurasidone]    Prednisone    Codeine Other (See Comments) and Rash   Penicillins Rash and Swelling   Sulfa Antibiotics Rash    Current Medications: Current Outpatient Medications  Medication Sig Dispense Refill   ALPRAZolam (XANAX) 0.25 MG tablet Take 0.5-1 tablets by mouth as needed.      anastrozole (ARIMIDEX) 1 MG tablet Take 1 tablet (1 mg total) by mouth daily. 90 tablet 3   calcium-vitamin D (OSCAL WITH D) 250-125 MG-UNIT tablet Take 1 tablet by mouth daily. PT STATES SHE IS USING A CALCIUM CHEWABLE GUMMY.  I INSTRUCTED HER TO BE SURE  TO GET 1200 MG TOTAL DAILY.  SHE SAYS SHE WILL CHECK DOSAGE ON BOTTLE.     carteolol (OCUPRESS) 1 % ophthalmic solution Place 1 drop into the left eye 2 (two) times daily.     cetirizine HCl (ZYRTEC) 5 MG/5ML SOLN Take by mouth.     citalopram (CELEXA) 20 MG tablet Take 20 mg by mouth daily.     co-enzyme Q-10 30 MG capsule Take by mouth.     denosumab (PROLIA) 60 MG/ML SOSY injection Inject 60 mg into the skin every 6 (six) months.     diphenhydrAMINE (SOMINEX) 25 MG tablet Take by mouth.     dorzolamide (TRUSOPT) 2 % ophthalmic solution Place 1 drop into the left eye 2 (two) times daily.     etodolac (LODINE) 400 MG tablet Take by mouth.     famotidine (PEPCID) 40 MG tablet Take 1 tablet (40 mg total) by mouth at bedtime. Please call [phone] to let us know how you are doing on this medication 30 tablet 1   Fluoxetine HCl, PMDD, 10 MG TABS Take by mouth.     gabapentin (NEURONTIN) 100 MG capsule Take 100 mg by mouth as needed.     levothyroxine (SYNTHROID) 75 MCG tablet Take 1 tablet by mouth daily.     lisinopril (ZESTRIL) 5 MG tablet Take by mouth.     lisinopril-hydrochlorothiazide (ZESTORETIC) 10-12.5 MG tablet Take 1 tablet by mouth daily.     loratadine (CLARITIN) 10 MG tablet Take 20 mg by mouth daily with breakfast.     MAGNESIUM PO Take 2 tablets by mouth daily.     metoprolol tartrate (LOPRESSOR) 25 MG tablet Take 12.5 mg by mouth 2 (two) times daily.     Multiple Minerals-Vitamins (CALCIUM & VIT D3 BONE HEALTH PO) Take 2  tablets by mouth daily. Chewables     mupirocin ointment (BACTROBAN) 2 % Place into the nose 2 (two) times daily.     nitroGLYCERIN (NITROSTAT) 0.4 MG SL tablet Place under the tongue.     pantoprazole (PROTONIX) 40 MG tablet Take 1 tablet (40 mg total) by mouth 2 (two) times daily. 60 tablet 3   potassium chloride SA (KLOR-CON) 20 MEQ tablet Take 20 mEq by mouth daily.     sucralfate (CARAFATE) 1 g tablet Take 1 g by mouth 2 (two) times daily.      triamcinolone (KENALOG) 0.1 % Apply topically 2 (two) times daily as needed.     No current facility-administered medications for this visit.     ASSESSMENT & PLAN:   Assessment:   1. History of stage IA hormone receptor positive left breast cancer December 2011 treated with lumpectomy and postoperative radiation, but she was unable to tolerate hormonal therapy.    2. 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  She has been on and off adjuvant hormonal therapy, due to difficulty tolerating it.  She has resumed anastrozole for the last 6 months without difficulty.  3. Osteoporosis, for which she was started on Prolia every 6 months in July 2021.  She has received 2 doses but wishes to stop it for now.  Her last DEXA had shown progression from osteopenia to osteoporosis, but not severe, with a T score of 2.7.  We will hold this until her next bone density scan, which would be due in 1 year.  I advised her to increase her calcium supplement to 2 pills twice daily, i.e. double the dose, since her calcium is only 8.6.  4. Small soft right axillary lipoma.  5. Nodules of the right mastectomy incision.  Biopsies revealed fat necrosis. She just has a mild firmness there now.  6. Chronic hyponatremia with apparent worsening this spring, very symptomatic.      Plan: We will cancel her Prolia planned for this week.  She knows to continue anastrozole daily, and is tolerating this without difficulty at this time.  We will see her back in 6 months with a CBC, comprehensive metabolic panel and TSH for repeat examination.  We will repeat her left mammogram and DEXA in 1 year. The patient understands the plans discussed today and is in agreement with them.  She knows to contact our office if she develops concerns regarding her breast cancer or its treatment.   I provided 20 minutes of face-to-face time during this this  encounter and > 50% was spent counseling as documented under my assessment and plan.    Derwood Kaplan, MD [facility] [address] [place] [address] Dept: [phone] Dept Fax: [phone]

## 2020-08-16 ENCOUNTER — Other Ambulatory Visit: Payer: Self-pay | Admitting: Hematology and Oncology

## 2020-08-16 ENCOUNTER — Inpatient Hospital Stay: Payer: Medicare Other | Attending: Oncology | Admitting: Oncology

## 2020-08-16 ENCOUNTER — Inpatient Hospital Stay: Payer: Medicare Other

## 2020-08-16 ENCOUNTER — Telehealth: Payer: Self-pay | Admitting: Oncology

## 2020-08-16 ENCOUNTER — Other Ambulatory Visit: Payer: Self-pay

## 2020-08-16 ENCOUNTER — Encounter: Payer: Self-pay | Admitting: Oncology

## 2020-08-16 ENCOUNTER — Other Ambulatory Visit: Payer: Self-pay | Admitting: Oncology

## 2020-08-16 VITALS — BP 139/63 | HR 69 | Temp 97.9°F | Resp 18 | Ht 62.75 in | Wt 146.5 lb

## 2020-08-16 DIAGNOSIS — C50211 Malignant neoplasm of upper-inner quadrant of right female breast: Secondary | ICD-10-CM | POA: Diagnosis not present

## 2020-08-16 DIAGNOSIS — C50212 Malignant neoplasm of upper-inner quadrant of left female breast: Secondary | ICD-10-CM | POA: Diagnosis not present

## 2020-08-16 DIAGNOSIS — Z170 Estrogen receptor positive status [ER+]: Secondary | ICD-10-CM

## 2020-08-16 DIAGNOSIS — M810 Age-related osteoporosis without current pathological fracture: Secondary | ICD-10-CM

## 2020-08-16 DIAGNOSIS — D649 Anemia, unspecified: Secondary | ICD-10-CM | POA: Diagnosis not present

## 2020-08-16 DIAGNOSIS — E039 Hypothyroidism, unspecified: Secondary | ICD-10-CM

## 2020-08-16 LAB — CBC AND DIFFERENTIAL
HCT: 37 (ref 36–46)
Hemoglobin: 12.6 (ref 12.0–16.0)
Neutrophils Absolute: 5.04
Platelets: 348 (ref 150–399)
WBC: 8

## 2020-08-16 LAB — COMPREHENSIVE METABOLIC PANEL
Albumin: 4 (ref 3.5–5.0)
Calcium: 8.6 — AB (ref 8.7–10.7)

## 2020-08-16 LAB — BASIC METABOLIC PANEL
BUN: 15 (ref 4–21)
CO2: 23 — AB (ref 13–22)
Chloride: 101 (ref 99–108)
Creatinine: 0.9 (ref 0.5–1.1)
Glucose: 101
Potassium: 4.4 (ref 3.4–5.3)
Sodium: 133 — AB (ref 137–147)

## 2020-08-16 LAB — HEPATIC FUNCTION PANEL
ALT: 17 (ref 7–35)
AST: 28 (ref 13–35)
Alkaline Phosphatase: 54 (ref 25–125)
Bilirubin, Total: 0.7

## 2020-08-16 LAB — CBC: RBC: 4.2 (ref 3.87–5.11)

## 2020-08-16 NOTE — Telephone Encounter (Signed)
Per 7/11 los next appt scheduled and given to patient 

## 2020-08-17 ENCOUNTER — Telehealth: Payer: Self-pay | Admitting: Oncology

## 2020-08-17 NOTE — Telephone Encounter (Signed)
Patient called to verify 7/13 Injection was Canceled

## 2020-08-18 ENCOUNTER — Encounter: Payer: Self-pay | Admitting: Oncology

## 2020-08-18 ENCOUNTER — Inpatient Hospital Stay: Payer: Medicare Other

## 2020-08-20 ENCOUNTER — Encounter: Payer: Self-pay | Admitting: Oncology

## 2020-09-01 DIAGNOSIS — J329 Chronic sinusitis, unspecified: Secondary | ICD-10-CM | POA: Diagnosis not present

## 2020-09-01 DIAGNOSIS — J40 Bronchitis, not specified as acute or chronic: Secondary | ICD-10-CM | POA: Diagnosis not present

## 2020-09-08 DIAGNOSIS — R072 Precordial pain: Secondary | ICD-10-CM | POA: Diagnosis not present

## 2020-09-08 DIAGNOSIS — I447 Left bundle-branch block, unspecified: Secondary | ICD-10-CM | POA: Diagnosis not present

## 2020-09-08 DIAGNOSIS — I201 Angina pectoris with documented spasm: Secondary | ICD-10-CM | POA: Diagnosis not present

## 2020-09-17 DIAGNOSIS — R059 Cough, unspecified: Secondary | ICD-10-CM | POA: Diagnosis not present

## 2020-09-17 DIAGNOSIS — J209 Acute bronchitis, unspecified: Secondary | ICD-10-CM | POA: Diagnosis not present

## 2020-09-20 DIAGNOSIS — J329 Chronic sinusitis, unspecified: Secondary | ICD-10-CM | POA: Diagnosis not present

## 2020-09-20 DIAGNOSIS — J40 Bronchitis, not specified as acute or chronic: Secondary | ICD-10-CM | POA: Diagnosis not present

## 2020-09-20 DIAGNOSIS — I10 Essential (primary) hypertension: Secondary | ICD-10-CM | POA: Diagnosis not present

## 2020-09-22 DIAGNOSIS — I447 Left bundle-branch block, unspecified: Secondary | ICD-10-CM

## 2020-09-22 DIAGNOSIS — R0600 Dyspnea, unspecified: Secondary | ICD-10-CM | POA: Diagnosis not present

## 2020-09-22 DIAGNOSIS — I10 Essential (primary) hypertension: Secondary | ICD-10-CM | POA: Diagnosis not present

## 2020-09-22 DIAGNOSIS — R0609 Other forms of dyspnea: Secondary | ICD-10-CM

## 2020-09-22 DIAGNOSIS — E785 Hyperlipidemia, unspecified: Secondary | ICD-10-CM | POA: Diagnosis not present

## 2020-09-22 DIAGNOSIS — R0789 Other chest pain: Secondary | ICD-10-CM | POA: Diagnosis not present

## 2020-09-22 DIAGNOSIS — R079 Chest pain, unspecified: Secondary | ICD-10-CM | POA: Diagnosis not present

## 2020-09-22 HISTORY — DX: Other chest pain: R07.89

## 2020-09-22 HISTORY — DX: Dyspnea, unspecified: R06.00

## 2020-09-22 HISTORY — DX: Left bundle-branch block, unspecified: I44.7

## 2020-09-22 HISTORY — DX: Other forms of dyspnea: R06.09

## 2020-09-23 DIAGNOSIS — I447 Left bundle-branch block, unspecified: Secondary | ICD-10-CM | POA: Diagnosis not present

## 2020-10-01 DIAGNOSIS — J329 Chronic sinusitis, unspecified: Secondary | ICD-10-CM | POA: Diagnosis not present

## 2020-10-01 DIAGNOSIS — J40 Bronchitis, not specified as acute or chronic: Secondary | ICD-10-CM | POA: Diagnosis not present

## 2020-10-04 DIAGNOSIS — R110 Nausea: Secondary | ICD-10-CM | POA: Diagnosis not present

## 2020-10-07 DIAGNOSIS — Z9181 History of falling: Secondary | ICD-10-CM | POA: Diagnosis not present

## 2020-10-07 DIAGNOSIS — M79605 Pain in left leg: Secondary | ICD-10-CM | POA: Diagnosis not present

## 2020-10-08 DIAGNOSIS — E871 Hypo-osmolality and hyponatremia: Secondary | ICD-10-CM

## 2020-10-08 DIAGNOSIS — R490 Dysphonia: Secondary | ICD-10-CM | POA: Diagnosis not present

## 2020-10-08 DIAGNOSIS — M47812 Spondylosis without myelopathy or radiculopathy, cervical region: Secondary | ICD-10-CM | POA: Diagnosis not present

## 2020-10-08 DIAGNOSIS — I6389 Other cerebral infarction: Secondary | ICD-10-CM | POA: Diagnosis not present

## 2020-10-08 DIAGNOSIS — I6503 Occlusion and stenosis of bilateral vertebral arteries: Secondary | ICD-10-CM | POA: Diagnosis not present

## 2020-10-08 DIAGNOSIS — R531 Weakness: Secondary | ICD-10-CM | POA: Diagnosis not present

## 2020-10-08 DIAGNOSIS — S2231XA Fracture of one rib, right side, initial encounter for closed fracture: Secondary | ICD-10-CM | POA: Diagnosis not present

## 2020-10-08 DIAGNOSIS — E7800 Pure hypercholesterolemia, unspecified: Secondary | ICD-10-CM | POA: Diagnosis not present

## 2020-10-08 DIAGNOSIS — Z20822 Contact with and (suspected) exposure to covid-19: Secondary | ICD-10-CM | POA: Diagnosis not present

## 2020-10-08 DIAGNOSIS — I9581 Postprocedural hypotension: Secondary | ICD-10-CM | POA: Diagnosis not present

## 2020-10-08 DIAGNOSIS — I63231 Cerebral infarction due to unspecified occlusion or stenosis of right carotid arteries: Secondary | ICD-10-CM | POA: Diagnosis not present

## 2020-10-08 DIAGNOSIS — E890 Postprocedural hypothyroidism: Secondary | ICD-10-CM | POA: Diagnosis not present

## 2020-10-08 DIAGNOSIS — I672 Cerebral atherosclerosis: Secondary | ICD-10-CM | POA: Diagnosis not present

## 2020-10-08 DIAGNOSIS — S2242XA Multiple fractures of ribs, left side, initial encounter for closed fracture: Secondary | ICD-10-CM | POA: Diagnosis not present

## 2020-10-08 DIAGNOSIS — W19XXXA Unspecified fall, initial encounter: Secondary | ICD-10-CM | POA: Diagnosis not present

## 2020-10-08 DIAGNOSIS — C50919 Malignant neoplasm of unspecified site of unspecified female breast: Secondary | ICD-10-CM

## 2020-10-08 DIAGNOSIS — I6521 Occlusion and stenosis of right carotid artery: Secondary | ICD-10-CM | POA: Diagnosis not present

## 2020-10-08 DIAGNOSIS — R296 Repeated falls: Secondary | ICD-10-CM

## 2020-10-08 DIAGNOSIS — Z006 Encounter for examination for normal comparison and control in clinical research program: Secondary | ICD-10-CM | POA: Diagnosis not present

## 2020-10-08 DIAGNOSIS — I340 Nonrheumatic mitral (valve) insufficiency: Secondary | ICD-10-CM | POA: Diagnosis not present

## 2020-10-08 DIAGNOSIS — I6381 Other cerebral infarction due to occlusion or stenosis of small artery: Secondary | ICD-10-CM | POA: Diagnosis not present

## 2020-10-08 DIAGNOSIS — J40 Bronchitis, not specified as acute or chronic: Secondary | ICD-10-CM | POA: Diagnosis not present

## 2020-10-08 DIAGNOSIS — Z85850 Personal history of malignant neoplasm of thyroid: Secondary | ICD-10-CM | POA: Diagnosis not present

## 2020-10-08 DIAGNOSIS — M25562 Pain in left knee: Secondary | ICD-10-CM | POA: Diagnosis not present

## 2020-10-08 DIAGNOSIS — R197 Diarrhea, unspecified: Secondary | ICD-10-CM | POA: Diagnosis not present

## 2020-10-08 DIAGNOSIS — R29898 Other symptoms and signs involving the musculoskeletal system: Secondary | ICD-10-CM | POA: Diagnosis not present

## 2020-10-08 DIAGNOSIS — H548 Legal blindness, as defined in USA: Secondary | ICD-10-CM | POA: Diagnosis not present

## 2020-10-08 DIAGNOSIS — M25552 Pain in left hip: Secondary | ICD-10-CM | POA: Diagnosis not present

## 2020-10-08 DIAGNOSIS — E222 Syndrome of inappropriate secretion of antidiuretic hormone: Secondary | ICD-10-CM | POA: Diagnosis not present

## 2020-10-08 DIAGNOSIS — Z853 Personal history of malignant neoplasm of breast: Secondary | ICD-10-CM | POA: Diagnosis not present

## 2020-10-08 DIAGNOSIS — E039 Hypothyroidism, unspecified: Secondary | ICD-10-CM | POA: Diagnosis not present

## 2020-10-08 DIAGNOSIS — M25572 Pain in left ankle and joints of left foot: Secondary | ICD-10-CM | POA: Diagnosis not present

## 2020-10-08 DIAGNOSIS — I959 Hypotension, unspecified: Secondary | ICD-10-CM | POA: Diagnosis not present

## 2020-10-08 DIAGNOSIS — J040 Acute laryngitis: Secondary | ICD-10-CM | POA: Diagnosis not present

## 2020-10-08 DIAGNOSIS — Z7902 Long term (current) use of antithrombotics/antiplatelets: Secondary | ICD-10-CM | POA: Diagnosis not present

## 2020-10-08 DIAGNOSIS — I700 Atherosclerosis of aorta: Secondary | ICD-10-CM | POA: Diagnosis not present

## 2020-10-08 DIAGNOSIS — M7989 Other specified soft tissue disorders: Secondary | ICD-10-CM | POA: Diagnosis not present

## 2020-10-08 DIAGNOSIS — S2249XA Multiple fractures of ribs, unspecified side, initial encounter for closed fracture: Secondary | ICD-10-CM

## 2020-10-08 DIAGNOSIS — I6523 Occlusion and stenosis of bilateral carotid arteries: Secondary | ICD-10-CM | POA: Diagnosis not present

## 2020-10-08 DIAGNOSIS — I639 Cerebral infarction, unspecified: Secondary | ICD-10-CM | POA: Diagnosis not present

## 2020-10-08 DIAGNOSIS — I447 Left bundle-branch block, unspecified: Secondary | ICD-10-CM | POA: Diagnosis not present

## 2020-10-08 DIAGNOSIS — I10 Essential (primary) hypertension: Secondary | ICD-10-CM | POA: Diagnosis not present

## 2020-10-08 DIAGNOSIS — G8194 Hemiplegia, unspecified affecting left nondominant side: Secondary | ICD-10-CM | POA: Diagnosis not present

## 2020-10-08 DIAGNOSIS — D490 Neoplasm of unspecified behavior of digestive system: Secondary | ICD-10-CM | POA: Diagnosis not present

## 2020-10-08 DIAGNOSIS — Z7982 Long term (current) use of aspirin: Secondary | ICD-10-CM | POA: Diagnosis not present

## 2020-10-08 HISTORY — DX: Hypo-osmolality and hyponatremia: E87.1

## 2020-10-08 HISTORY — DX: Weakness: R53.1

## 2020-10-08 HISTORY — DX: Repeated falls: R29.6

## 2020-10-08 HISTORY — DX: Malignant neoplasm of unspecified site of unspecified female breast: C50.919

## 2020-10-08 HISTORY — DX: Multiple fractures of ribs, unspecified side, initial encounter for closed fracture: S22.49XA

## 2020-10-08 HISTORY — DX: Other cerebral infarction due to occlusion or stenosis of small artery: I63.81

## 2020-10-09 DIAGNOSIS — I6523 Occlusion and stenosis of bilateral carotid arteries: Secondary | ICD-10-CM | POA: Diagnosis not present

## 2020-10-09 DIAGNOSIS — R296 Repeated falls: Secondary | ICD-10-CM | POA: Diagnosis not present

## 2020-10-09 DIAGNOSIS — I639 Cerebral infarction, unspecified: Secondary | ICD-10-CM | POA: Diagnosis not present

## 2020-10-09 DIAGNOSIS — I340 Nonrheumatic mitral (valve) insufficiency: Secondary | ICD-10-CM | POA: Diagnosis not present

## 2020-10-09 DIAGNOSIS — J40 Bronchitis, not specified as acute or chronic: Secondary | ICD-10-CM | POA: Diagnosis not present

## 2020-10-09 DIAGNOSIS — R531 Weakness: Secondary | ICD-10-CM | POA: Diagnosis not present

## 2020-10-09 DIAGNOSIS — I6389 Other cerebral infarction: Secondary | ICD-10-CM | POA: Diagnosis not present

## 2020-10-09 DIAGNOSIS — I6381 Other cerebral infarction due to occlusion or stenosis of small artery: Secondary | ICD-10-CM | POA: Diagnosis not present

## 2020-10-09 DIAGNOSIS — Z7982 Long term (current) use of aspirin: Secondary | ICD-10-CM | POA: Diagnosis not present

## 2020-10-09 DIAGNOSIS — Z853 Personal history of malignant neoplasm of breast: Secondary | ICD-10-CM | POA: Diagnosis not present

## 2020-10-09 DIAGNOSIS — E039 Hypothyroidism, unspecified: Secondary | ICD-10-CM | POA: Diagnosis not present

## 2020-10-09 DIAGNOSIS — E871 Hypo-osmolality and hyponatremia: Secondary | ICD-10-CM | POA: Diagnosis not present

## 2020-10-09 DIAGNOSIS — I10 Essential (primary) hypertension: Secondary | ICD-10-CM | POA: Diagnosis not present

## 2020-10-09 DIAGNOSIS — Z7902 Long term (current) use of antithrombotics/antiplatelets: Secondary | ICD-10-CM | POA: Diagnosis not present

## 2020-10-09 DIAGNOSIS — S2249XA Multiple fractures of ribs, unspecified side, initial encounter for closed fracture: Secondary | ICD-10-CM | POA: Diagnosis not present

## 2020-10-10 DIAGNOSIS — R531 Weakness: Secondary | ICD-10-CM | POA: Diagnosis not present

## 2020-10-10 DIAGNOSIS — E871 Hypo-osmolality and hyponatremia: Secondary | ICD-10-CM | POA: Diagnosis not present

## 2020-10-10 DIAGNOSIS — S2249XA Multiple fractures of ribs, unspecified side, initial encounter for closed fracture: Secondary | ICD-10-CM | POA: Diagnosis not present

## 2020-10-10 DIAGNOSIS — R296 Repeated falls: Secondary | ICD-10-CM | POA: Diagnosis not present

## 2020-10-10 DIAGNOSIS — I639 Cerebral infarction, unspecified: Secondary | ICD-10-CM | POA: Diagnosis not present

## 2020-10-11 DIAGNOSIS — E039 Hypothyroidism, unspecified: Secondary | ICD-10-CM | POA: Diagnosis not present

## 2020-10-11 DIAGNOSIS — Z7982 Long term (current) use of aspirin: Secondary | ICD-10-CM | POA: Diagnosis not present

## 2020-10-11 DIAGNOSIS — I6503 Occlusion and stenosis of bilateral vertebral arteries: Secondary | ICD-10-CM | POA: Diagnosis not present

## 2020-10-11 DIAGNOSIS — I639 Cerebral infarction, unspecified: Secondary | ICD-10-CM | POA: Diagnosis not present

## 2020-10-11 DIAGNOSIS — M47812 Spondylosis without myelopathy or radiculopathy, cervical region: Secondary | ICD-10-CM | POA: Diagnosis not present

## 2020-10-11 DIAGNOSIS — S2249XA Multiple fractures of ribs, unspecified side, initial encounter for closed fracture: Secondary | ICD-10-CM | POA: Diagnosis not present

## 2020-10-11 DIAGNOSIS — Z853 Personal history of malignant neoplasm of breast: Secondary | ICD-10-CM | POA: Diagnosis not present

## 2020-10-11 DIAGNOSIS — R531 Weakness: Secondary | ICD-10-CM | POA: Diagnosis not present

## 2020-10-11 DIAGNOSIS — I672 Cerebral atherosclerosis: Secondary | ICD-10-CM | POA: Diagnosis not present

## 2020-10-11 DIAGNOSIS — R296 Repeated falls: Secondary | ICD-10-CM | POA: Diagnosis not present

## 2020-10-11 DIAGNOSIS — I63231 Cerebral infarction due to unspecified occlusion or stenosis of right carotid arteries: Secondary | ICD-10-CM | POA: Diagnosis not present

## 2020-10-11 DIAGNOSIS — J40 Bronchitis, not specified as acute or chronic: Secondary | ICD-10-CM | POA: Diagnosis not present

## 2020-10-11 DIAGNOSIS — E871 Hypo-osmolality and hyponatremia: Secondary | ICD-10-CM | POA: Diagnosis not present

## 2020-10-11 DIAGNOSIS — I10 Essential (primary) hypertension: Secondary | ICD-10-CM | POA: Diagnosis not present

## 2020-10-11 DIAGNOSIS — Z7902 Long term (current) use of antithrombotics/antiplatelets: Secondary | ICD-10-CM | POA: Diagnosis not present

## 2020-10-11 DIAGNOSIS — I6523 Occlusion and stenosis of bilateral carotid arteries: Secondary | ICD-10-CM | POA: Diagnosis not present

## 2020-10-12 DIAGNOSIS — E039 Hypothyroidism, unspecified: Secondary | ICD-10-CM | POA: Diagnosis not present

## 2020-10-12 DIAGNOSIS — E871 Hypo-osmolality and hyponatremia: Secondary | ICD-10-CM | POA: Diagnosis not present

## 2020-10-12 DIAGNOSIS — S2249XA Multiple fractures of ribs, unspecified side, initial encounter for closed fracture: Secondary | ICD-10-CM | POA: Diagnosis not present

## 2020-10-12 DIAGNOSIS — R531 Weakness: Secondary | ICD-10-CM | POA: Diagnosis not present

## 2020-10-12 DIAGNOSIS — R296 Repeated falls: Secondary | ICD-10-CM | POA: Diagnosis not present

## 2020-10-12 DIAGNOSIS — I10 Essential (primary) hypertension: Secondary | ICD-10-CM | POA: Diagnosis not present

## 2020-10-12 DIAGNOSIS — R490 Dysphonia: Secondary | ICD-10-CM | POA: Diagnosis not present

## 2020-10-12 DIAGNOSIS — D490 Neoplasm of unspecified behavior of digestive system: Secondary | ICD-10-CM | POA: Diagnosis not present

## 2020-10-12 DIAGNOSIS — M25562 Pain in left knee: Secondary | ICD-10-CM | POA: Diagnosis not present

## 2020-10-12 DIAGNOSIS — I639 Cerebral infarction, unspecified: Secondary | ICD-10-CM | POA: Diagnosis not present

## 2020-10-12 DIAGNOSIS — J040 Acute laryngitis: Secondary | ICD-10-CM | POA: Diagnosis not present

## 2020-10-13 DIAGNOSIS — D490 Neoplasm of unspecified behavior of digestive system: Secondary | ICD-10-CM | POA: Diagnosis not present

## 2020-10-13 DIAGNOSIS — R296 Repeated falls: Secondary | ICD-10-CM | POA: Diagnosis not present

## 2020-10-13 DIAGNOSIS — I10 Essential (primary) hypertension: Secondary | ICD-10-CM | POA: Diagnosis not present

## 2020-10-13 DIAGNOSIS — R531 Weakness: Secondary | ICD-10-CM | POA: Diagnosis not present

## 2020-10-13 DIAGNOSIS — I6521 Occlusion and stenosis of right carotid artery: Secondary | ICD-10-CM | POA: Diagnosis not present

## 2020-10-13 DIAGNOSIS — E871 Hypo-osmolality and hyponatremia: Secondary | ICD-10-CM | POA: Diagnosis not present

## 2020-10-13 DIAGNOSIS — Z7982 Long term (current) use of aspirin: Secondary | ICD-10-CM | POA: Diagnosis not present

## 2020-10-13 DIAGNOSIS — E039 Hypothyroidism, unspecified: Secondary | ICD-10-CM | POA: Diagnosis not present

## 2020-10-13 DIAGNOSIS — I639 Cerebral infarction, unspecified: Secondary | ICD-10-CM | POA: Diagnosis not present

## 2020-10-13 DIAGNOSIS — S2249XA Multiple fractures of ribs, unspecified side, initial encounter for closed fracture: Secondary | ICD-10-CM | POA: Diagnosis not present

## 2020-10-14 ENCOUNTER — Other Ambulatory Visit: Payer: Self-pay

## 2020-10-14 DIAGNOSIS — H3320 Serous retinal detachment, unspecified eye: Secondary | ICD-10-CM | POA: Insufficient documentation

## 2020-10-14 DIAGNOSIS — E871 Hypo-osmolality and hyponatremia: Secondary | ICD-10-CM | POA: Diagnosis not present

## 2020-10-14 DIAGNOSIS — R531 Weakness: Secondary | ICD-10-CM | POA: Diagnosis not present

## 2020-10-14 DIAGNOSIS — K219 Gastro-esophageal reflux disease without esophagitis: Secondary | ICD-10-CM | POA: Insufficient documentation

## 2020-10-14 DIAGNOSIS — Z7982 Long term (current) use of aspirin: Secondary | ICD-10-CM | POA: Diagnosis not present

## 2020-10-14 DIAGNOSIS — I10 Essential (primary) hypertension: Secondary | ICD-10-CM | POA: Diagnosis not present

## 2020-10-14 DIAGNOSIS — I639 Cerebral infarction, unspecified: Secondary | ICD-10-CM | POA: Diagnosis not present

## 2020-10-14 DIAGNOSIS — K5909 Other constipation: Secondary | ICD-10-CM | POA: Insufficient documentation

## 2020-10-14 DIAGNOSIS — R296 Repeated falls: Secondary | ICD-10-CM | POA: Diagnosis not present

## 2020-10-14 DIAGNOSIS — E039 Hypothyroidism, unspecified: Secondary | ICD-10-CM | POA: Diagnosis not present

## 2020-10-14 DIAGNOSIS — F32A Depression, unspecified: Secondary | ICD-10-CM | POA: Insufficient documentation

## 2020-10-14 DIAGNOSIS — S2249XA Multiple fractures of ribs, unspecified side, initial encounter for closed fracture: Secondary | ICD-10-CM | POA: Diagnosis not present

## 2020-10-14 DIAGNOSIS — F419 Anxiety disorder, unspecified: Secondary | ICD-10-CM | POA: Insufficient documentation

## 2020-10-14 DIAGNOSIS — D490 Neoplasm of unspecified behavior of digestive system: Secondary | ICD-10-CM | POA: Diagnosis not present

## 2020-10-14 DIAGNOSIS — I6521 Occlusion and stenosis of right carotid artery: Secondary | ICD-10-CM | POA: Diagnosis not present

## 2020-10-14 DIAGNOSIS — E785 Hyperlipidemia, unspecified: Secondary | ICD-10-CM | POA: Insufficient documentation

## 2020-10-14 DIAGNOSIS — R079 Chest pain, unspecified: Secondary | ICD-10-CM | POA: Insufficient documentation

## 2020-10-14 DIAGNOSIS — Z853 Personal history of malignant neoplasm of breast: Secondary | ICD-10-CM | POA: Insufficient documentation

## 2020-10-15 DIAGNOSIS — R531 Weakness: Secondary | ICD-10-CM | POA: Diagnosis not present

## 2020-10-15 DIAGNOSIS — I639 Cerebral infarction, unspecified: Secondary | ICD-10-CM | POA: Diagnosis not present

## 2020-10-15 DIAGNOSIS — Z7982 Long term (current) use of aspirin: Secondary | ICD-10-CM | POA: Diagnosis not present

## 2020-10-15 DIAGNOSIS — S2249XA Multiple fractures of ribs, unspecified side, initial encounter for closed fracture: Secondary | ICD-10-CM | POA: Diagnosis not present

## 2020-10-15 DIAGNOSIS — E039 Hypothyroidism, unspecified: Secondary | ICD-10-CM | POA: Diagnosis not present

## 2020-10-15 DIAGNOSIS — I6521 Occlusion and stenosis of right carotid artery: Secondary | ICD-10-CM | POA: Diagnosis not present

## 2020-10-15 DIAGNOSIS — R296 Repeated falls: Secondary | ICD-10-CM | POA: Diagnosis not present

## 2020-10-15 DIAGNOSIS — Z7902 Long term (current) use of antithrombotics/antiplatelets: Secondary | ICD-10-CM | POA: Diagnosis not present

## 2020-10-15 DIAGNOSIS — I10 Essential (primary) hypertension: Secondary | ICD-10-CM | POA: Diagnosis not present

## 2020-10-15 DIAGNOSIS — I6381 Other cerebral infarction due to occlusion or stenosis of small artery: Secondary | ICD-10-CM | POA: Diagnosis not present

## 2020-10-15 DIAGNOSIS — E871 Hypo-osmolality and hyponatremia: Secondary | ICD-10-CM | POA: Diagnosis not present

## 2020-10-16 DIAGNOSIS — I10 Essential (primary) hypertension: Secondary | ICD-10-CM | POA: Diagnosis not present

## 2020-10-16 DIAGNOSIS — I6521 Occlusion and stenosis of right carotid artery: Secondary | ICD-10-CM | POA: Diagnosis not present

## 2020-10-16 DIAGNOSIS — R531 Weakness: Secondary | ICD-10-CM | POA: Diagnosis not present

## 2020-10-16 DIAGNOSIS — I9581 Postprocedural hypotension: Secondary | ICD-10-CM | POA: Diagnosis not present

## 2020-10-16 DIAGNOSIS — Z7902 Long term (current) use of antithrombotics/antiplatelets: Secondary | ICD-10-CM | POA: Diagnosis not present

## 2020-10-16 DIAGNOSIS — S2249XA Multiple fractures of ribs, unspecified side, initial encounter for closed fracture: Secondary | ICD-10-CM | POA: Diagnosis not present

## 2020-10-16 DIAGNOSIS — E039 Hypothyroidism, unspecified: Secondary | ICD-10-CM | POA: Diagnosis not present

## 2020-10-16 DIAGNOSIS — R296 Repeated falls: Secondary | ICD-10-CM | POA: Diagnosis not present

## 2020-10-16 DIAGNOSIS — Z7982 Long term (current) use of aspirin: Secondary | ICD-10-CM | POA: Diagnosis not present

## 2020-10-16 DIAGNOSIS — I639 Cerebral infarction, unspecified: Secondary | ICD-10-CM | POA: Diagnosis not present

## 2020-10-16 DIAGNOSIS — E871 Hypo-osmolality and hyponatremia: Secondary | ICD-10-CM | POA: Diagnosis not present

## 2020-10-17 DIAGNOSIS — R296 Repeated falls: Secondary | ICD-10-CM | POA: Diagnosis not present

## 2020-10-17 DIAGNOSIS — M7989 Other specified soft tissue disorders: Secondary | ICD-10-CM | POA: Diagnosis not present

## 2020-10-17 DIAGNOSIS — R531 Weakness: Secondary | ICD-10-CM | POA: Diagnosis not present

## 2020-10-17 DIAGNOSIS — E039 Hypothyroidism, unspecified: Secondary | ICD-10-CM | POA: Diagnosis not present

## 2020-10-17 DIAGNOSIS — E871 Hypo-osmolality and hyponatremia: Secondary | ICD-10-CM | POA: Diagnosis not present

## 2020-10-17 DIAGNOSIS — S2249XA Multiple fractures of ribs, unspecified side, initial encounter for closed fracture: Secondary | ICD-10-CM | POA: Diagnosis not present

## 2020-10-17 DIAGNOSIS — I639 Cerebral infarction, unspecified: Secondary | ICD-10-CM | POA: Diagnosis not present

## 2020-10-17 DIAGNOSIS — M25572 Pain in left ankle and joints of left foot: Secondary | ICD-10-CM | POA: Diagnosis not present

## 2020-10-17 DIAGNOSIS — I10 Essential (primary) hypertension: Secondary | ICD-10-CM | POA: Diagnosis not present

## 2020-10-18 DIAGNOSIS — R296 Repeated falls: Secondary | ICD-10-CM | POA: Diagnosis not present

## 2020-10-18 DIAGNOSIS — S2249XA Multiple fractures of ribs, unspecified side, initial encounter for closed fracture: Secondary | ICD-10-CM | POA: Diagnosis not present

## 2020-10-18 DIAGNOSIS — E871 Hypo-osmolality and hyponatremia: Secondary | ICD-10-CM | POA: Diagnosis not present

## 2020-10-18 DIAGNOSIS — Z7982 Long term (current) use of aspirin: Secondary | ICD-10-CM | POA: Diagnosis not present

## 2020-10-18 DIAGNOSIS — I639 Cerebral infarction, unspecified: Secondary | ICD-10-CM | POA: Diagnosis not present

## 2020-10-18 DIAGNOSIS — Z7902 Long term (current) use of antithrombotics/antiplatelets: Secondary | ICD-10-CM | POA: Diagnosis not present

## 2020-10-18 DIAGNOSIS — E039 Hypothyroidism, unspecified: Secondary | ICD-10-CM | POA: Diagnosis not present

## 2020-10-18 DIAGNOSIS — R531 Weakness: Secondary | ICD-10-CM | POA: Diagnosis not present

## 2020-10-18 DIAGNOSIS — I10 Essential (primary) hypertension: Secondary | ICD-10-CM | POA: Diagnosis not present

## 2020-10-19 DIAGNOSIS — E785 Hyperlipidemia, unspecified: Secondary | ICD-10-CM | POA: Diagnosis not present

## 2020-10-19 DIAGNOSIS — Z7409 Other reduced mobility: Secondary | ICD-10-CM | POA: Diagnosis not present

## 2020-10-19 DIAGNOSIS — E871 Hypo-osmolality and hyponatremia: Secondary | ICD-10-CM | POA: Diagnosis not present

## 2020-10-19 DIAGNOSIS — H5462 Unqualified visual loss, left eye, normal vision right eye: Secondary | ICD-10-CM | POA: Diagnosis not present

## 2020-10-19 DIAGNOSIS — I447 Left bundle-branch block, unspecified: Secondary | ICD-10-CM | POA: Diagnosis not present

## 2020-10-19 DIAGNOSIS — S2242XD Multiple fractures of ribs, left side, subsequent encounter for fracture with routine healing: Secondary | ICD-10-CM | POA: Diagnosis not present

## 2020-10-19 DIAGNOSIS — Z789 Other specified health status: Secondary | ICD-10-CM | POA: Insufficient documentation

## 2020-10-19 DIAGNOSIS — H409 Unspecified glaucoma: Secondary | ICD-10-CM | POA: Diagnosis not present

## 2020-10-19 DIAGNOSIS — I639 Cerebral infarction, unspecified: Secondary | ICD-10-CM | POA: Diagnosis not present

## 2020-10-19 DIAGNOSIS — S2249XA Multiple fractures of ribs, unspecified side, initial encounter for closed fracture: Secondary | ICD-10-CM | POA: Diagnosis not present

## 2020-10-19 DIAGNOSIS — S2249XD Multiple fractures of ribs, unspecified side, subsequent encounter for fracture with routine healing: Secondary | ICD-10-CM | POA: Diagnosis not present

## 2020-10-19 DIAGNOSIS — M25562 Pain in left knee: Secondary | ICD-10-CM | POA: Diagnosis not present

## 2020-10-19 DIAGNOSIS — K219 Gastro-esophageal reflux disease without esophagitis: Secondary | ICD-10-CM | POA: Diagnosis not present

## 2020-10-19 DIAGNOSIS — R531 Weakness: Secondary | ICD-10-CM | POA: Diagnosis not present

## 2020-10-19 DIAGNOSIS — R9389 Abnormal findings on diagnostic imaging of other specified body structures: Secondary | ICD-10-CM | POA: Diagnosis not present

## 2020-10-19 DIAGNOSIS — I69354 Hemiplegia and hemiparesis following cerebral infarction affecting left non-dominant side: Secondary | ICD-10-CM | POA: Diagnosis not present

## 2020-10-19 DIAGNOSIS — H548 Legal blindness, as defined in USA: Secondary | ICD-10-CM | POA: Diagnosis not present

## 2020-10-19 DIAGNOSIS — H6092 Unspecified otitis externa, left ear: Secondary | ICD-10-CM | POA: Diagnosis not present

## 2020-10-19 DIAGNOSIS — C50919 Malignant neoplasm of unspecified site of unspecified female breast: Secondary | ICD-10-CM | POA: Diagnosis not present

## 2020-10-19 DIAGNOSIS — R490 Dysphonia: Secondary | ICD-10-CM | POA: Diagnosis not present

## 2020-10-19 DIAGNOSIS — R222 Localized swelling, mass and lump, trunk: Secondary | ICD-10-CM | POA: Diagnosis not present

## 2020-10-19 DIAGNOSIS — K5900 Constipation, unspecified: Secondary | ICD-10-CM | POA: Diagnosis not present

## 2020-10-19 DIAGNOSIS — R6889 Other general symptoms and signs: Secondary | ICD-10-CM | POA: Diagnosis not present

## 2020-10-19 DIAGNOSIS — E876 Hypokalemia: Secondary | ICD-10-CM | POA: Diagnosis not present

## 2020-10-19 DIAGNOSIS — Z9012 Acquired absence of left breast and nipple: Secondary | ICD-10-CM | POA: Diagnosis not present

## 2020-10-19 DIAGNOSIS — Z7982 Long term (current) use of aspirin: Secondary | ICD-10-CM | POA: Diagnosis not present

## 2020-10-19 DIAGNOSIS — R296 Repeated falls: Secondary | ICD-10-CM | POA: Diagnosis not present

## 2020-10-19 DIAGNOSIS — J40 Bronchitis, not specified as acute or chronic: Secondary | ICD-10-CM | POA: Diagnosis not present

## 2020-10-19 DIAGNOSIS — I10 Essential (primary) hypertension: Secondary | ICD-10-CM | POA: Diagnosis not present

## 2020-10-19 DIAGNOSIS — E039 Hypothyroidism, unspecified: Secondary | ICD-10-CM | POA: Diagnosis not present

## 2020-10-19 DIAGNOSIS — H5440 Blindness, one eye, unspecified eye: Secondary | ICD-10-CM | POA: Diagnosis not present

## 2020-10-19 DIAGNOSIS — Z9889 Other specified postprocedural states: Secondary | ICD-10-CM | POA: Insufficient documentation

## 2020-10-19 DIAGNOSIS — J209 Acute bronchitis, unspecified: Secondary | ICD-10-CM | POA: Diagnosis not present

## 2020-10-19 DIAGNOSIS — Z7902 Long term (current) use of antithrombotics/antiplatelets: Secondary | ICD-10-CM | POA: Diagnosis not present

## 2020-10-19 DIAGNOSIS — I6521 Occlusion and stenosis of right carotid artery: Secondary | ICD-10-CM | POA: Diagnosis not present

## 2020-10-20 DIAGNOSIS — I10 Essential (primary) hypertension: Secondary | ICD-10-CM | POA: Diagnosis not present

## 2020-10-20 DIAGNOSIS — E039 Hypothyroidism, unspecified: Secondary | ICD-10-CM | POA: Diagnosis not present

## 2020-10-20 DIAGNOSIS — Z7902 Long term (current) use of antithrombotics/antiplatelets: Secondary | ICD-10-CM | POA: Diagnosis not present

## 2020-10-20 DIAGNOSIS — E871 Hypo-osmolality and hyponatremia: Secondary | ICD-10-CM | POA: Diagnosis not present

## 2020-10-20 DIAGNOSIS — Z7982 Long term (current) use of aspirin: Secondary | ICD-10-CM | POA: Diagnosis not present

## 2020-10-20 DIAGNOSIS — H5440 Blindness, one eye, unspecified eye: Secondary | ICD-10-CM | POA: Diagnosis not present

## 2020-10-20 DIAGNOSIS — S2242XD Multiple fractures of ribs, left side, subsequent encounter for fracture with routine healing: Secondary | ICD-10-CM | POA: Diagnosis not present

## 2020-10-20 DIAGNOSIS — I69354 Hemiplegia and hemiparesis following cerebral infarction affecting left non-dominant side: Secondary | ICD-10-CM | POA: Diagnosis not present

## 2020-10-20 DIAGNOSIS — K219 Gastro-esophageal reflux disease without esophagitis: Secondary | ICD-10-CM | POA: Diagnosis not present

## 2020-10-20 DIAGNOSIS — H409 Unspecified glaucoma: Secondary | ICD-10-CM | POA: Diagnosis not present

## 2020-10-20 DIAGNOSIS — Z9012 Acquired absence of left breast and nipple: Secondary | ICD-10-CM | POA: Diagnosis not present

## 2020-10-21 DIAGNOSIS — Z7902 Long term (current) use of antithrombotics/antiplatelets: Secondary | ICD-10-CM | POA: Diagnosis not present

## 2020-10-21 DIAGNOSIS — E039 Hypothyroidism, unspecified: Secondary | ICD-10-CM | POA: Diagnosis not present

## 2020-10-21 DIAGNOSIS — I10 Essential (primary) hypertension: Secondary | ICD-10-CM | POA: Diagnosis not present

## 2020-10-21 DIAGNOSIS — S2242XD Multiple fractures of ribs, left side, subsequent encounter for fracture with routine healing: Secondary | ICD-10-CM | POA: Diagnosis not present

## 2020-10-21 DIAGNOSIS — K219 Gastro-esophageal reflux disease without esophagitis: Secondary | ICD-10-CM | POA: Diagnosis not present

## 2020-10-21 DIAGNOSIS — Z7982 Long term (current) use of aspirin: Secondary | ICD-10-CM | POA: Diagnosis not present

## 2020-10-21 DIAGNOSIS — Z9012 Acquired absence of left breast and nipple: Secondary | ICD-10-CM | POA: Diagnosis not present

## 2020-10-21 DIAGNOSIS — E871 Hypo-osmolality and hyponatremia: Secondary | ICD-10-CM | POA: Diagnosis not present

## 2020-10-21 DIAGNOSIS — H5440 Blindness, one eye, unspecified eye: Secondary | ICD-10-CM | POA: Diagnosis not present

## 2020-10-21 DIAGNOSIS — H409 Unspecified glaucoma: Secondary | ICD-10-CM | POA: Diagnosis not present

## 2020-10-21 DIAGNOSIS — I69354 Hemiplegia and hemiparesis following cerebral infarction affecting left non-dominant side: Secondary | ICD-10-CM | POA: Diagnosis not present

## 2020-10-22 DIAGNOSIS — I6521 Occlusion and stenosis of right carotid artery: Secondary | ICD-10-CM | POA: Diagnosis not present

## 2020-10-22 DIAGNOSIS — C50919 Malignant neoplasm of unspecified site of unspecified female breast: Secondary | ICD-10-CM | POA: Diagnosis not present

## 2020-10-22 DIAGNOSIS — H409 Unspecified glaucoma: Secondary | ICD-10-CM | POA: Diagnosis not present

## 2020-10-22 DIAGNOSIS — I69354 Hemiplegia and hemiparesis following cerebral infarction affecting left non-dominant side: Secondary | ICD-10-CM | POA: Diagnosis not present

## 2020-10-22 DIAGNOSIS — Z7409 Other reduced mobility: Secondary | ICD-10-CM | POA: Diagnosis not present

## 2020-10-22 DIAGNOSIS — J40 Bronchitis, not specified as acute or chronic: Secondary | ICD-10-CM | POA: Diagnosis not present

## 2020-10-22 DIAGNOSIS — S2242XD Multiple fractures of ribs, left side, subsequent encounter for fracture with routine healing: Secondary | ICD-10-CM | POA: Diagnosis not present

## 2020-10-22 DIAGNOSIS — R296 Repeated falls: Secondary | ICD-10-CM | POA: Diagnosis not present

## 2020-10-22 DIAGNOSIS — I10 Essential (primary) hypertension: Secondary | ICD-10-CM | POA: Diagnosis not present

## 2020-10-22 DIAGNOSIS — E871 Hypo-osmolality and hyponatremia: Secondary | ICD-10-CM | POA: Diagnosis not present

## 2020-10-22 DIAGNOSIS — I639 Cerebral infarction, unspecified: Secondary | ICD-10-CM | POA: Diagnosis not present

## 2020-10-23 DIAGNOSIS — R296 Repeated falls: Secondary | ICD-10-CM | POA: Diagnosis not present

## 2020-10-23 DIAGNOSIS — S2242XD Multiple fractures of ribs, left side, subsequent encounter for fracture with routine healing: Secondary | ICD-10-CM | POA: Diagnosis not present

## 2020-10-23 DIAGNOSIS — I69354 Hemiplegia and hemiparesis following cerebral infarction affecting left non-dominant side: Secondary | ICD-10-CM | POA: Diagnosis not present

## 2020-10-23 DIAGNOSIS — H409 Unspecified glaucoma: Secondary | ICD-10-CM | POA: Diagnosis not present

## 2020-10-23 DIAGNOSIS — C50919 Malignant neoplasm of unspecified site of unspecified female breast: Secondary | ICD-10-CM | POA: Diagnosis not present

## 2020-10-23 DIAGNOSIS — H5440 Blindness, one eye, unspecified eye: Secondary | ICD-10-CM | POA: Diagnosis not present

## 2020-10-23 DIAGNOSIS — E871 Hypo-osmolality and hyponatremia: Secondary | ICD-10-CM | POA: Diagnosis not present

## 2020-10-23 DIAGNOSIS — I6521 Occlusion and stenosis of right carotid artery: Secondary | ICD-10-CM | POA: Diagnosis not present

## 2020-10-23 DIAGNOSIS — Z7409 Other reduced mobility: Secondary | ICD-10-CM | POA: Diagnosis not present

## 2020-10-23 DIAGNOSIS — I10 Essential (primary) hypertension: Secondary | ICD-10-CM | POA: Diagnosis not present

## 2020-10-23 DIAGNOSIS — J40 Bronchitis, not specified as acute or chronic: Secondary | ICD-10-CM | POA: Diagnosis not present

## 2020-10-24 DIAGNOSIS — S2242XD Multiple fractures of ribs, left side, subsequent encounter for fracture with routine healing: Secondary | ICD-10-CM | POA: Diagnosis not present

## 2020-10-24 DIAGNOSIS — Z7409 Other reduced mobility: Secondary | ICD-10-CM | POA: Diagnosis not present

## 2020-10-24 DIAGNOSIS — H5440 Blindness, one eye, unspecified eye: Secondary | ICD-10-CM | POA: Diagnosis not present

## 2020-10-24 DIAGNOSIS — I6521 Occlusion and stenosis of right carotid artery: Secondary | ICD-10-CM | POA: Diagnosis not present

## 2020-10-24 DIAGNOSIS — I447 Left bundle-branch block, unspecified: Secondary | ICD-10-CM | POA: Diagnosis not present

## 2020-10-24 DIAGNOSIS — E871 Hypo-osmolality and hyponatremia: Secondary | ICD-10-CM | POA: Diagnosis not present

## 2020-10-24 DIAGNOSIS — H409 Unspecified glaucoma: Secondary | ICD-10-CM | POA: Diagnosis not present

## 2020-10-24 DIAGNOSIS — I69354 Hemiplegia and hemiparesis following cerebral infarction affecting left non-dominant side: Secondary | ICD-10-CM | POA: Diagnosis not present

## 2020-10-24 DIAGNOSIS — C50919 Malignant neoplasm of unspecified site of unspecified female breast: Secondary | ICD-10-CM | POA: Diagnosis not present

## 2020-10-24 DIAGNOSIS — J40 Bronchitis, not specified as acute or chronic: Secondary | ICD-10-CM | POA: Diagnosis not present

## 2020-10-24 DIAGNOSIS — R296 Repeated falls: Secondary | ICD-10-CM | POA: Diagnosis not present

## 2020-10-24 DIAGNOSIS — I10 Essential (primary) hypertension: Secondary | ICD-10-CM | POA: Diagnosis not present

## 2020-10-25 ENCOUNTER — Ambulatory Visit: Payer: Medicare Other | Admitting: Cardiology

## 2020-10-25 DIAGNOSIS — C50919 Malignant neoplasm of unspecified site of unspecified female breast: Secondary | ICD-10-CM | POA: Diagnosis not present

## 2020-10-25 DIAGNOSIS — Z7409 Other reduced mobility: Secondary | ICD-10-CM | POA: Diagnosis not present

## 2020-10-25 DIAGNOSIS — R9389 Abnormal findings on diagnostic imaging of other specified body structures: Secondary | ICD-10-CM | POA: Diagnosis not present

## 2020-10-25 DIAGNOSIS — I6521 Occlusion and stenosis of right carotid artery: Secondary | ICD-10-CM | POA: Diagnosis not present

## 2020-10-25 DIAGNOSIS — I69354 Hemiplegia and hemiparesis following cerebral infarction affecting left non-dominant side: Secondary | ICD-10-CM | POA: Diagnosis not present

## 2020-10-25 DIAGNOSIS — E871 Hypo-osmolality and hyponatremia: Secondary | ICD-10-CM | POA: Diagnosis not present

## 2020-10-25 DIAGNOSIS — E039 Hypothyroidism, unspecified: Secondary | ICD-10-CM | POA: Diagnosis not present

## 2020-10-25 DIAGNOSIS — H409 Unspecified glaucoma: Secondary | ICD-10-CM | POA: Diagnosis not present

## 2020-10-25 DIAGNOSIS — I10 Essential (primary) hypertension: Secondary | ICD-10-CM | POA: Diagnosis not present

## 2020-10-25 DIAGNOSIS — S2242XD Multiple fractures of ribs, left side, subsequent encounter for fracture with routine healing: Secondary | ICD-10-CM | POA: Diagnosis not present

## 2020-10-25 DIAGNOSIS — J209 Acute bronchitis, unspecified: Secondary | ICD-10-CM | POA: Diagnosis not present

## 2020-10-29 DIAGNOSIS — M25562 Pain in left knee: Secondary | ICD-10-CM | POA: Diagnosis not present

## 2020-10-29 DIAGNOSIS — R531 Weakness: Secondary | ICD-10-CM | POA: Diagnosis not present

## 2020-11-04 DIAGNOSIS — I639 Cerebral infarction, unspecified: Secondary | ICD-10-CM | POA: Diagnosis not present

## 2020-11-08 DIAGNOSIS — I69354 Hemiplegia and hemiparesis following cerebral infarction affecting left non-dominant side: Secondary | ICD-10-CM | POA: Diagnosis not present

## 2020-11-08 DIAGNOSIS — F32A Depression, unspecified: Secondary | ICD-10-CM | POA: Diagnosis not present

## 2020-11-08 DIAGNOSIS — Z9181 History of falling: Secondary | ICD-10-CM | POA: Diagnosis not present

## 2020-11-08 DIAGNOSIS — I10 Essential (primary) hypertension: Secondary | ICD-10-CM | POA: Diagnosis not present

## 2020-11-08 DIAGNOSIS — Z9012 Acquired absence of left breast and nipple: Secondary | ICD-10-CM | POA: Diagnosis not present

## 2020-11-08 DIAGNOSIS — Z853 Personal history of malignant neoplasm of breast: Secondary | ICD-10-CM | POA: Diagnosis not present

## 2020-11-08 DIAGNOSIS — J209 Acute bronchitis, unspecified: Secondary | ICD-10-CM | POA: Diagnosis not present

## 2020-11-08 DIAGNOSIS — S2242XD Multiple fractures of ribs, left side, subsequent encounter for fracture with routine healing: Secondary | ICD-10-CM | POA: Diagnosis not present

## 2020-11-08 DIAGNOSIS — H547 Unspecified visual loss: Secondary | ICD-10-CM | POA: Diagnosis not present

## 2020-11-08 DIAGNOSIS — E871 Hypo-osmolality and hyponatremia: Secondary | ICD-10-CM | POA: Diagnosis not present

## 2020-11-08 DIAGNOSIS — Z85820 Personal history of malignant melanoma of skin: Secondary | ICD-10-CM | POA: Diagnosis not present

## 2020-11-08 DIAGNOSIS — K219 Gastro-esophageal reflux disease without esophagitis: Secondary | ICD-10-CM | POA: Diagnosis not present

## 2020-11-08 DIAGNOSIS — E039 Hypothyroidism, unspecified: Secondary | ICD-10-CM | POA: Diagnosis not present

## 2020-11-08 DIAGNOSIS — H409 Unspecified glaucoma: Secondary | ICD-10-CM | POA: Diagnosis not present

## 2020-11-09 DIAGNOSIS — H9202 Otalgia, left ear: Secondary | ICD-10-CM | POA: Diagnosis not present

## 2020-11-09 DIAGNOSIS — R490 Dysphonia: Secondary | ICD-10-CM | POA: Diagnosis not present

## 2020-11-10 DIAGNOSIS — Z8673 Personal history of transient ischemic attack (TIA), and cerebral infarction without residual deficits: Secondary | ICD-10-CM | POA: Diagnosis not present

## 2020-11-10 DIAGNOSIS — I69349 Monoplegia of lower limb following cerebral infarction affecting unspecified side: Secondary | ICD-10-CM | POA: Diagnosis not present

## 2020-11-10 DIAGNOSIS — Z853 Personal history of malignant neoplasm of breast: Secondary | ICD-10-CM | POA: Diagnosis not present

## 2020-11-10 DIAGNOSIS — Z85820 Personal history of malignant melanoma of skin: Secondary | ICD-10-CM | POA: Diagnosis not present

## 2020-11-10 DIAGNOSIS — E871 Hypo-osmolality and hyponatremia: Secondary | ICD-10-CM | POA: Diagnosis not present

## 2020-11-10 DIAGNOSIS — I69339 Monoplegia of upper limb following cerebral infarction affecting unspecified side: Secondary | ICD-10-CM | POA: Diagnosis not present

## 2020-11-10 DIAGNOSIS — K219 Gastro-esophageal reflux disease without esophagitis: Secondary | ICD-10-CM | POA: Diagnosis not present

## 2020-11-10 DIAGNOSIS — I69354 Hemiplegia and hemiparesis following cerebral infarction affecting left non-dominant side: Secondary | ICD-10-CM | POA: Diagnosis not present

## 2020-11-10 DIAGNOSIS — Z9012 Acquired absence of left breast and nipple: Secondary | ICD-10-CM | POA: Diagnosis not present

## 2020-11-10 DIAGNOSIS — H547 Unspecified visual loss: Secondary | ICD-10-CM | POA: Diagnosis not present

## 2020-11-10 DIAGNOSIS — J209 Acute bronchitis, unspecified: Secondary | ICD-10-CM | POA: Diagnosis not present

## 2020-11-10 DIAGNOSIS — I10 Essential (primary) hypertension: Secondary | ICD-10-CM | POA: Diagnosis not present

## 2020-11-10 DIAGNOSIS — H409 Unspecified glaucoma: Secondary | ICD-10-CM | POA: Diagnosis not present

## 2020-11-10 DIAGNOSIS — E039 Hypothyroidism, unspecified: Secondary | ICD-10-CM | POA: Diagnosis not present

## 2020-11-10 DIAGNOSIS — S2242XD Multiple fractures of ribs, left side, subsequent encounter for fracture with routine healing: Secondary | ICD-10-CM | POA: Diagnosis not present

## 2020-11-10 DIAGNOSIS — Z9181 History of falling: Secondary | ICD-10-CM | POA: Diagnosis not present

## 2020-11-10 DIAGNOSIS — F32A Depression, unspecified: Secondary | ICD-10-CM | POA: Diagnosis not present

## 2020-11-11 DIAGNOSIS — R12 Heartburn: Secondary | ICD-10-CM | POA: Diagnosis not present

## 2020-11-11 DIAGNOSIS — R140 Abdominal distension (gaseous): Secondary | ICD-10-CM | POA: Diagnosis not present

## 2020-11-12 DIAGNOSIS — Z9012 Acquired absence of left breast and nipple: Secondary | ICD-10-CM | POA: Diagnosis not present

## 2020-11-12 DIAGNOSIS — S2242XD Multiple fractures of ribs, left side, subsequent encounter for fracture with routine healing: Secondary | ICD-10-CM | POA: Diagnosis not present

## 2020-11-12 DIAGNOSIS — J209 Acute bronchitis, unspecified: Secondary | ICD-10-CM | POA: Diagnosis not present

## 2020-11-12 DIAGNOSIS — E039 Hypothyroidism, unspecified: Secondary | ICD-10-CM | POA: Diagnosis not present

## 2020-11-12 DIAGNOSIS — E871 Hypo-osmolality and hyponatremia: Secondary | ICD-10-CM | POA: Diagnosis not present

## 2020-11-12 DIAGNOSIS — I10 Essential (primary) hypertension: Secondary | ICD-10-CM | POA: Diagnosis not present

## 2020-11-12 DIAGNOSIS — K219 Gastro-esophageal reflux disease without esophagitis: Secondary | ICD-10-CM | POA: Diagnosis not present

## 2020-11-12 DIAGNOSIS — I69354 Hemiplegia and hemiparesis following cerebral infarction affecting left non-dominant side: Secondary | ICD-10-CM | POA: Diagnosis not present

## 2020-11-12 DIAGNOSIS — H547 Unspecified visual loss: Secondary | ICD-10-CM | POA: Diagnosis not present

## 2020-11-12 DIAGNOSIS — Z9181 History of falling: Secondary | ICD-10-CM | POA: Diagnosis not present

## 2020-11-12 DIAGNOSIS — F32A Depression, unspecified: Secondary | ICD-10-CM | POA: Diagnosis not present

## 2020-11-12 DIAGNOSIS — Z85820 Personal history of malignant melanoma of skin: Secondary | ICD-10-CM | POA: Diagnosis not present

## 2020-11-12 DIAGNOSIS — Z853 Personal history of malignant neoplasm of breast: Secondary | ICD-10-CM | POA: Diagnosis not present

## 2020-11-12 DIAGNOSIS — H409 Unspecified glaucoma: Secondary | ICD-10-CM | POA: Diagnosis not present

## 2020-11-16 DIAGNOSIS — I6389 Other cerebral infarction: Secondary | ICD-10-CM | POA: Diagnosis not present

## 2020-11-16 DIAGNOSIS — K219 Gastro-esophageal reflux disease without esophagitis: Secondary | ICD-10-CM | POA: Diagnosis not present

## 2020-11-16 DIAGNOSIS — Z85820 Personal history of malignant melanoma of skin: Secondary | ICD-10-CM | POA: Diagnosis not present

## 2020-11-16 DIAGNOSIS — F32A Depression, unspecified: Secondary | ICD-10-CM | POA: Diagnosis not present

## 2020-11-16 DIAGNOSIS — Z9181 History of falling: Secondary | ICD-10-CM | POA: Diagnosis not present

## 2020-11-16 DIAGNOSIS — H409 Unspecified glaucoma: Secondary | ICD-10-CM | POA: Diagnosis not present

## 2020-11-16 DIAGNOSIS — E039 Hypothyroidism, unspecified: Secondary | ICD-10-CM | POA: Diagnosis not present

## 2020-11-16 DIAGNOSIS — Z853 Personal history of malignant neoplasm of breast: Secondary | ICD-10-CM | POA: Diagnosis not present

## 2020-11-16 DIAGNOSIS — S2242XD Multiple fractures of ribs, left side, subsequent encounter for fracture with routine healing: Secondary | ICD-10-CM | POA: Diagnosis not present

## 2020-11-16 DIAGNOSIS — E871 Hypo-osmolality and hyponatremia: Secondary | ICD-10-CM | POA: Diagnosis not present

## 2020-11-16 DIAGNOSIS — I10 Essential (primary) hypertension: Secondary | ICD-10-CM | POA: Diagnosis not present

## 2020-11-16 DIAGNOSIS — H547 Unspecified visual loss: Secondary | ICD-10-CM | POA: Diagnosis not present

## 2020-11-16 DIAGNOSIS — J209 Acute bronchitis, unspecified: Secondary | ICD-10-CM | POA: Diagnosis not present

## 2020-11-16 DIAGNOSIS — I69354 Hemiplegia and hemiparesis following cerebral infarction affecting left non-dominant side: Secondary | ICD-10-CM | POA: Diagnosis not present

## 2020-11-16 DIAGNOSIS — Z9012 Acquired absence of left breast and nipple: Secondary | ICD-10-CM | POA: Diagnosis not present

## 2020-11-18 DIAGNOSIS — Z9181 History of falling: Secondary | ICD-10-CM | POA: Diagnosis not present

## 2020-11-18 DIAGNOSIS — Z853 Personal history of malignant neoplasm of breast: Secondary | ICD-10-CM | POA: Diagnosis not present

## 2020-11-18 DIAGNOSIS — E039 Hypothyroidism, unspecified: Secondary | ICD-10-CM | POA: Diagnosis not present

## 2020-11-18 DIAGNOSIS — F32A Depression, unspecified: Secondary | ICD-10-CM | POA: Diagnosis not present

## 2020-11-18 DIAGNOSIS — H547 Unspecified visual loss: Secondary | ICD-10-CM | POA: Diagnosis not present

## 2020-11-18 DIAGNOSIS — Z9012 Acquired absence of left breast and nipple: Secondary | ICD-10-CM | POA: Diagnosis not present

## 2020-11-18 DIAGNOSIS — J209 Acute bronchitis, unspecified: Secondary | ICD-10-CM | POA: Diagnosis not present

## 2020-11-18 DIAGNOSIS — H409 Unspecified glaucoma: Secondary | ICD-10-CM | POA: Diagnosis not present

## 2020-11-18 DIAGNOSIS — Z85820 Personal history of malignant melanoma of skin: Secondary | ICD-10-CM | POA: Diagnosis not present

## 2020-11-18 DIAGNOSIS — S2242XD Multiple fractures of ribs, left side, subsequent encounter for fracture with routine healing: Secondary | ICD-10-CM | POA: Diagnosis not present

## 2020-11-18 DIAGNOSIS — K219 Gastro-esophageal reflux disease without esophagitis: Secondary | ICD-10-CM | POA: Diagnosis not present

## 2020-11-18 DIAGNOSIS — I69354 Hemiplegia and hemiparesis following cerebral infarction affecting left non-dominant side: Secondary | ICD-10-CM | POA: Diagnosis not present

## 2020-11-18 DIAGNOSIS — E871 Hypo-osmolality and hyponatremia: Secondary | ICD-10-CM | POA: Diagnosis not present

## 2020-11-18 DIAGNOSIS — I10 Essential (primary) hypertension: Secondary | ICD-10-CM | POA: Diagnosis not present

## 2020-11-19 DIAGNOSIS — Z95828 Presence of other vascular implants and grafts: Secondary | ICD-10-CM | POA: Diagnosis not present

## 2020-11-19 DIAGNOSIS — I6522 Occlusion and stenosis of left carotid artery: Secondary | ICD-10-CM | POA: Diagnosis not present

## 2020-11-22 DIAGNOSIS — Z853 Personal history of malignant neoplasm of breast: Secondary | ICD-10-CM | POA: Diagnosis not present

## 2020-11-22 DIAGNOSIS — I10 Essential (primary) hypertension: Secondary | ICD-10-CM | POA: Diagnosis not present

## 2020-11-22 DIAGNOSIS — H547 Unspecified visual loss: Secondary | ICD-10-CM | POA: Diagnosis not present

## 2020-11-22 DIAGNOSIS — K219 Gastro-esophageal reflux disease without esophagitis: Secondary | ICD-10-CM | POA: Diagnosis not present

## 2020-11-22 DIAGNOSIS — H409 Unspecified glaucoma: Secondary | ICD-10-CM | POA: Diagnosis not present

## 2020-11-22 DIAGNOSIS — Z85820 Personal history of malignant melanoma of skin: Secondary | ICD-10-CM | POA: Diagnosis not present

## 2020-11-22 DIAGNOSIS — S2242XD Multiple fractures of ribs, left side, subsequent encounter for fracture with routine healing: Secondary | ICD-10-CM | POA: Diagnosis not present

## 2020-11-22 DIAGNOSIS — E039 Hypothyroidism, unspecified: Secondary | ICD-10-CM | POA: Diagnosis not present

## 2020-11-22 DIAGNOSIS — Z9012 Acquired absence of left breast and nipple: Secondary | ICD-10-CM | POA: Diagnosis not present

## 2020-11-22 DIAGNOSIS — J209 Acute bronchitis, unspecified: Secondary | ICD-10-CM | POA: Diagnosis not present

## 2020-11-22 DIAGNOSIS — Z9181 History of falling: Secondary | ICD-10-CM | POA: Diagnosis not present

## 2020-11-22 DIAGNOSIS — E871 Hypo-osmolality and hyponatremia: Secondary | ICD-10-CM | POA: Diagnosis not present

## 2020-11-22 DIAGNOSIS — F32A Depression, unspecified: Secondary | ICD-10-CM | POA: Diagnosis not present

## 2020-11-22 DIAGNOSIS — I69354 Hemiplegia and hemiparesis following cerebral infarction affecting left non-dominant side: Secondary | ICD-10-CM | POA: Diagnosis not present

## 2020-11-23 DIAGNOSIS — I69354 Hemiplegia and hemiparesis following cerebral infarction affecting left non-dominant side: Secondary | ICD-10-CM | POA: Diagnosis not present

## 2020-11-25 DIAGNOSIS — J209 Acute bronchitis, unspecified: Secondary | ICD-10-CM | POA: Diagnosis not present

## 2020-11-25 DIAGNOSIS — E039 Hypothyroidism, unspecified: Secondary | ICD-10-CM | POA: Diagnosis not present

## 2020-11-25 DIAGNOSIS — Z85820 Personal history of malignant melanoma of skin: Secondary | ICD-10-CM | POA: Diagnosis not present

## 2020-11-25 DIAGNOSIS — E871 Hypo-osmolality and hyponatremia: Secondary | ICD-10-CM | POA: Diagnosis not present

## 2020-11-25 DIAGNOSIS — K219 Gastro-esophageal reflux disease without esophagitis: Secondary | ICD-10-CM | POA: Diagnosis not present

## 2020-11-25 DIAGNOSIS — I69354 Hemiplegia and hemiparesis following cerebral infarction affecting left non-dominant side: Secondary | ICD-10-CM | POA: Diagnosis not present

## 2020-11-25 DIAGNOSIS — H409 Unspecified glaucoma: Secondary | ICD-10-CM | POA: Diagnosis not present

## 2020-11-25 DIAGNOSIS — I10 Essential (primary) hypertension: Secondary | ICD-10-CM | POA: Diagnosis not present

## 2020-11-25 DIAGNOSIS — Z9012 Acquired absence of left breast and nipple: Secondary | ICD-10-CM | POA: Diagnosis not present

## 2020-11-25 DIAGNOSIS — Z9181 History of falling: Secondary | ICD-10-CM | POA: Diagnosis not present

## 2020-11-25 DIAGNOSIS — S2242XD Multiple fractures of ribs, left side, subsequent encounter for fracture with routine healing: Secondary | ICD-10-CM | POA: Diagnosis not present

## 2020-11-25 DIAGNOSIS — Z853 Personal history of malignant neoplasm of breast: Secondary | ICD-10-CM | POA: Diagnosis not present

## 2020-11-25 DIAGNOSIS — H547 Unspecified visual loss: Secondary | ICD-10-CM | POA: Diagnosis not present

## 2020-11-25 DIAGNOSIS — F32A Depression, unspecified: Secondary | ICD-10-CM | POA: Diagnosis not present

## 2020-11-30 DIAGNOSIS — E871 Hypo-osmolality and hyponatremia: Secondary | ICD-10-CM | POA: Diagnosis not present

## 2020-11-30 DIAGNOSIS — Z85820 Personal history of malignant melanoma of skin: Secondary | ICD-10-CM | POA: Diagnosis not present

## 2020-11-30 DIAGNOSIS — S2242XD Multiple fractures of ribs, left side, subsequent encounter for fracture with routine healing: Secondary | ICD-10-CM | POA: Diagnosis not present

## 2020-11-30 DIAGNOSIS — J209 Acute bronchitis, unspecified: Secondary | ICD-10-CM | POA: Diagnosis not present

## 2020-11-30 DIAGNOSIS — I10 Essential (primary) hypertension: Secondary | ICD-10-CM | POA: Diagnosis not present

## 2020-11-30 DIAGNOSIS — Z853 Personal history of malignant neoplasm of breast: Secondary | ICD-10-CM | POA: Diagnosis not present

## 2020-11-30 DIAGNOSIS — F32A Depression, unspecified: Secondary | ICD-10-CM | POA: Diagnosis not present

## 2020-11-30 DIAGNOSIS — H547 Unspecified visual loss: Secondary | ICD-10-CM | POA: Diagnosis not present

## 2020-11-30 DIAGNOSIS — K219 Gastro-esophageal reflux disease without esophagitis: Secondary | ICD-10-CM | POA: Diagnosis not present

## 2020-11-30 DIAGNOSIS — I69354 Hemiplegia and hemiparesis following cerebral infarction affecting left non-dominant side: Secondary | ICD-10-CM | POA: Diagnosis not present

## 2020-11-30 DIAGNOSIS — E039 Hypothyroidism, unspecified: Secondary | ICD-10-CM | POA: Diagnosis not present

## 2020-11-30 DIAGNOSIS — Z9012 Acquired absence of left breast and nipple: Secondary | ICD-10-CM | POA: Diagnosis not present

## 2020-11-30 DIAGNOSIS — Z9181 History of falling: Secondary | ICD-10-CM | POA: Diagnosis not present

## 2020-11-30 DIAGNOSIS — H409 Unspecified glaucoma: Secondary | ICD-10-CM | POA: Diagnosis not present

## 2020-12-02 DIAGNOSIS — Z9181 History of falling: Secondary | ICD-10-CM | POA: Diagnosis not present

## 2020-12-02 DIAGNOSIS — K219 Gastro-esophageal reflux disease without esophagitis: Secondary | ICD-10-CM | POA: Diagnosis not present

## 2020-12-02 DIAGNOSIS — Z85820 Personal history of malignant melanoma of skin: Secondary | ICD-10-CM | POA: Diagnosis not present

## 2020-12-02 DIAGNOSIS — Z853 Personal history of malignant neoplasm of breast: Secondary | ICD-10-CM | POA: Diagnosis not present

## 2020-12-02 DIAGNOSIS — J209 Acute bronchitis, unspecified: Secondary | ICD-10-CM | POA: Diagnosis not present

## 2020-12-02 DIAGNOSIS — Z9012 Acquired absence of left breast and nipple: Secondary | ICD-10-CM | POA: Diagnosis not present

## 2020-12-02 DIAGNOSIS — H409 Unspecified glaucoma: Secondary | ICD-10-CM | POA: Diagnosis not present

## 2020-12-02 DIAGNOSIS — F32A Depression, unspecified: Secondary | ICD-10-CM | POA: Diagnosis not present

## 2020-12-02 DIAGNOSIS — E039 Hypothyroidism, unspecified: Secondary | ICD-10-CM | POA: Diagnosis not present

## 2020-12-02 DIAGNOSIS — S2242XD Multiple fractures of ribs, left side, subsequent encounter for fracture with routine healing: Secondary | ICD-10-CM | POA: Diagnosis not present

## 2020-12-02 DIAGNOSIS — I10 Essential (primary) hypertension: Secondary | ICD-10-CM | POA: Diagnosis not present

## 2020-12-02 DIAGNOSIS — I69354 Hemiplegia and hemiparesis following cerebral infarction affecting left non-dominant side: Secondary | ICD-10-CM | POA: Diagnosis not present

## 2020-12-02 DIAGNOSIS — H547 Unspecified visual loss: Secondary | ICD-10-CM | POA: Diagnosis not present

## 2020-12-02 DIAGNOSIS — E871 Hypo-osmolality and hyponatremia: Secondary | ICD-10-CM | POA: Diagnosis not present

## 2020-12-03 DIAGNOSIS — E039 Hypothyroidism, unspecified: Secondary | ICD-10-CM | POA: Diagnosis not present

## 2020-12-03 DIAGNOSIS — I10 Essential (primary) hypertension: Secondary | ICD-10-CM | POA: Diagnosis not present

## 2020-12-03 DIAGNOSIS — K219 Gastro-esophageal reflux disease without esophagitis: Secondary | ICD-10-CM | POA: Diagnosis not present

## 2020-12-03 DIAGNOSIS — J209 Acute bronchitis, unspecified: Secondary | ICD-10-CM | POA: Diagnosis not present

## 2020-12-03 DIAGNOSIS — S2242XD Multiple fractures of ribs, left side, subsequent encounter for fracture with routine healing: Secondary | ICD-10-CM | POA: Diagnosis not present

## 2020-12-03 DIAGNOSIS — E871 Hypo-osmolality and hyponatremia: Secondary | ICD-10-CM | POA: Diagnosis not present

## 2020-12-03 DIAGNOSIS — I69354 Hemiplegia and hemiparesis following cerebral infarction affecting left non-dominant side: Secondary | ICD-10-CM | POA: Diagnosis not present

## 2020-12-03 DIAGNOSIS — F32A Depression, unspecified: Secondary | ICD-10-CM | POA: Diagnosis not present

## 2020-12-03 DIAGNOSIS — Z853 Personal history of malignant neoplasm of breast: Secondary | ICD-10-CM | POA: Diagnosis not present

## 2020-12-03 DIAGNOSIS — Z85820 Personal history of malignant melanoma of skin: Secondary | ICD-10-CM | POA: Diagnosis not present

## 2020-12-03 DIAGNOSIS — H409 Unspecified glaucoma: Secondary | ICD-10-CM | POA: Diagnosis not present

## 2020-12-03 DIAGNOSIS — Z9012 Acquired absence of left breast and nipple: Secondary | ICD-10-CM | POA: Diagnosis not present

## 2020-12-03 DIAGNOSIS — H547 Unspecified visual loss: Secondary | ICD-10-CM | POA: Diagnosis not present

## 2020-12-03 DIAGNOSIS — Z9181 History of falling: Secondary | ICD-10-CM | POA: Diagnosis not present

## 2020-12-04 DIAGNOSIS — I639 Cerebral infarction, unspecified: Secondary | ICD-10-CM | POA: Diagnosis not present

## 2020-12-06 DIAGNOSIS — K219 Gastro-esophageal reflux disease without esophagitis: Secondary | ICD-10-CM | POA: Diagnosis not present

## 2020-12-06 DIAGNOSIS — E871 Hypo-osmolality and hyponatremia: Secondary | ICD-10-CM | POA: Diagnosis not present

## 2020-12-06 DIAGNOSIS — J209 Acute bronchitis, unspecified: Secondary | ICD-10-CM | POA: Diagnosis not present

## 2020-12-06 DIAGNOSIS — S2242XD Multiple fractures of ribs, left side, subsequent encounter for fracture with routine healing: Secondary | ICD-10-CM | POA: Diagnosis not present

## 2020-12-06 DIAGNOSIS — Z9181 History of falling: Secondary | ICD-10-CM | POA: Diagnosis not present

## 2020-12-06 DIAGNOSIS — H547 Unspecified visual loss: Secondary | ICD-10-CM | POA: Diagnosis not present

## 2020-12-06 DIAGNOSIS — F32A Depression, unspecified: Secondary | ICD-10-CM | POA: Diagnosis not present

## 2020-12-06 DIAGNOSIS — I69354 Hemiplegia and hemiparesis following cerebral infarction affecting left non-dominant side: Secondary | ICD-10-CM | POA: Diagnosis not present

## 2020-12-06 DIAGNOSIS — I10 Essential (primary) hypertension: Secondary | ICD-10-CM | POA: Diagnosis not present

## 2020-12-06 DIAGNOSIS — E782 Mixed hyperlipidemia: Secondary | ICD-10-CM | POA: Diagnosis not present

## 2020-12-06 DIAGNOSIS — Z853 Personal history of malignant neoplasm of breast: Secondary | ICD-10-CM | POA: Diagnosis not present

## 2020-12-06 DIAGNOSIS — Z9012 Acquired absence of left breast and nipple: Secondary | ICD-10-CM | POA: Diagnosis not present

## 2020-12-06 DIAGNOSIS — Z85820 Personal history of malignant melanoma of skin: Secondary | ICD-10-CM | POA: Diagnosis not present

## 2020-12-06 DIAGNOSIS — H409 Unspecified glaucoma: Secondary | ICD-10-CM | POA: Diagnosis not present

## 2020-12-06 DIAGNOSIS — E039 Hypothyroidism, unspecified: Secondary | ICD-10-CM | POA: Diagnosis not present

## 2020-12-06 DIAGNOSIS — M810 Age-related osteoporosis without current pathological fracture: Secondary | ICD-10-CM | POA: Diagnosis not present

## 2020-12-07 DIAGNOSIS — M1712 Unilateral primary osteoarthritis, left knee: Secondary | ICD-10-CM | POA: Diagnosis not present

## 2020-12-07 DIAGNOSIS — I69354 Hemiplegia and hemiparesis following cerebral infarction affecting left non-dominant side: Secondary | ICD-10-CM | POA: Diagnosis not present

## 2020-12-07 DIAGNOSIS — G4700 Insomnia, unspecified: Secondary | ICD-10-CM | POA: Diagnosis not present

## 2020-12-07 DIAGNOSIS — Z0000 Encounter for general adult medical examination without abnormal findings: Secondary | ICD-10-CM | POA: Diagnosis not present

## 2020-12-07 DIAGNOSIS — I70213 Atherosclerosis of native arteries of extremities with intermittent claudication, bilateral legs: Secondary | ICD-10-CM | POA: Diagnosis not present

## 2020-12-07 DIAGNOSIS — I6521 Occlusion and stenosis of right carotid artery: Secondary | ICD-10-CM | POA: Diagnosis not present

## 2020-12-07 DIAGNOSIS — I672 Cerebral atherosclerosis: Secondary | ICD-10-CM | POA: Diagnosis not present

## 2020-12-07 DIAGNOSIS — Z8673 Personal history of transient ischemic attack (TIA), and cerebral infarction without residual deficits: Secondary | ICD-10-CM | POA: Diagnosis not present

## 2020-12-07 DIAGNOSIS — E782 Mixed hyperlipidemia: Secondary | ICD-10-CM | POA: Diagnosis not present

## 2020-12-07 DIAGNOSIS — I10 Essential (primary) hypertension: Secondary | ICD-10-CM | POA: Diagnosis not present

## 2020-12-08 ENCOUNTER — Other Ambulatory Visit: Payer: Self-pay | Admitting: Oncology

## 2020-12-08 DIAGNOSIS — E871 Hypo-osmolality and hyponatremia: Secondary | ICD-10-CM | POA: Diagnosis not present

## 2020-12-08 DIAGNOSIS — S2242XD Multiple fractures of ribs, left side, subsequent encounter for fracture with routine healing: Secondary | ICD-10-CM | POA: Diagnosis not present

## 2020-12-08 DIAGNOSIS — K219 Gastro-esophageal reflux disease without esophagitis: Secondary | ICD-10-CM | POA: Diagnosis not present

## 2020-12-08 DIAGNOSIS — I10 Essential (primary) hypertension: Secondary | ICD-10-CM | POA: Diagnosis not present

## 2020-12-08 DIAGNOSIS — J209 Acute bronchitis, unspecified: Secondary | ICD-10-CM | POA: Diagnosis not present

## 2020-12-08 DIAGNOSIS — F32A Depression, unspecified: Secondary | ICD-10-CM | POA: Diagnosis not present

## 2020-12-08 DIAGNOSIS — H547 Unspecified visual loss: Secondary | ICD-10-CM | POA: Diagnosis not present

## 2020-12-08 DIAGNOSIS — Z85820 Personal history of malignant melanoma of skin: Secondary | ICD-10-CM | POA: Diagnosis not present

## 2020-12-08 DIAGNOSIS — E039 Hypothyroidism, unspecified: Secondary | ICD-10-CM | POA: Diagnosis not present

## 2020-12-08 DIAGNOSIS — Z9012 Acquired absence of left breast and nipple: Secondary | ICD-10-CM | POA: Diagnosis not present

## 2020-12-08 DIAGNOSIS — I69354 Hemiplegia and hemiparesis following cerebral infarction affecting left non-dominant side: Secondary | ICD-10-CM | POA: Diagnosis not present

## 2020-12-08 DIAGNOSIS — Z9181 History of falling: Secondary | ICD-10-CM | POA: Diagnosis not present

## 2020-12-08 DIAGNOSIS — Z853 Personal history of malignant neoplasm of breast: Secondary | ICD-10-CM | POA: Diagnosis not present

## 2020-12-08 DIAGNOSIS — M810 Age-related osteoporosis without current pathological fracture: Secondary | ICD-10-CM

## 2020-12-08 DIAGNOSIS — I70213 Atherosclerosis of native arteries of extremities with intermittent claudication, bilateral legs: Secondary | ICD-10-CM | POA: Diagnosis not present

## 2020-12-08 DIAGNOSIS — H409 Unspecified glaucoma: Secondary | ICD-10-CM | POA: Diagnosis not present

## 2020-12-13 DIAGNOSIS — H547 Unspecified visual loss: Secondary | ICD-10-CM | POA: Diagnosis not present

## 2020-12-13 DIAGNOSIS — E039 Hypothyroidism, unspecified: Secondary | ICD-10-CM | POA: Diagnosis not present

## 2020-12-13 DIAGNOSIS — Z9012 Acquired absence of left breast and nipple: Secondary | ICD-10-CM | POA: Diagnosis not present

## 2020-12-13 DIAGNOSIS — J209 Acute bronchitis, unspecified: Secondary | ICD-10-CM | POA: Diagnosis not present

## 2020-12-13 DIAGNOSIS — K219 Gastro-esophageal reflux disease without esophagitis: Secondary | ICD-10-CM | POA: Diagnosis not present

## 2020-12-13 DIAGNOSIS — F32A Depression, unspecified: Secondary | ICD-10-CM | POA: Diagnosis not present

## 2020-12-13 DIAGNOSIS — S2242XD Multiple fractures of ribs, left side, subsequent encounter for fracture with routine healing: Secondary | ICD-10-CM | POA: Diagnosis not present

## 2020-12-13 DIAGNOSIS — H409 Unspecified glaucoma: Secondary | ICD-10-CM | POA: Diagnosis not present

## 2020-12-13 DIAGNOSIS — I69354 Hemiplegia and hemiparesis following cerebral infarction affecting left non-dominant side: Secondary | ICD-10-CM | POA: Diagnosis not present

## 2020-12-13 DIAGNOSIS — E871 Hypo-osmolality and hyponatremia: Secondary | ICD-10-CM | POA: Diagnosis not present

## 2020-12-13 DIAGNOSIS — Z9181 History of falling: Secondary | ICD-10-CM | POA: Diagnosis not present

## 2020-12-13 DIAGNOSIS — I10 Essential (primary) hypertension: Secondary | ICD-10-CM | POA: Diagnosis not present

## 2020-12-13 DIAGNOSIS — Z85820 Personal history of malignant melanoma of skin: Secondary | ICD-10-CM | POA: Diagnosis not present

## 2020-12-13 DIAGNOSIS — Z853 Personal history of malignant neoplasm of breast: Secondary | ICD-10-CM | POA: Diagnosis not present

## 2020-12-15 DIAGNOSIS — S2242XD Multiple fractures of ribs, left side, subsequent encounter for fracture with routine healing: Secondary | ICD-10-CM | POA: Diagnosis not present

## 2020-12-15 DIAGNOSIS — I69354 Hemiplegia and hemiparesis following cerebral infarction affecting left non-dominant side: Secondary | ICD-10-CM | POA: Diagnosis not present

## 2020-12-15 DIAGNOSIS — E039 Hypothyroidism, unspecified: Secondary | ICD-10-CM | POA: Diagnosis not present

## 2020-12-15 DIAGNOSIS — Z9012 Acquired absence of left breast and nipple: Secondary | ICD-10-CM | POA: Diagnosis not present

## 2020-12-15 DIAGNOSIS — S8002XA Contusion of left knee, initial encounter: Secondary | ICD-10-CM | POA: Diagnosis not present

## 2020-12-15 DIAGNOSIS — Z9181 History of falling: Secondary | ICD-10-CM | POA: Diagnosis not present

## 2020-12-15 DIAGNOSIS — I10 Essential (primary) hypertension: Secondary | ICD-10-CM | POA: Diagnosis not present

## 2020-12-15 DIAGNOSIS — Z853 Personal history of malignant neoplasm of breast: Secondary | ICD-10-CM | POA: Diagnosis not present

## 2020-12-15 DIAGNOSIS — H547 Unspecified visual loss: Secondary | ICD-10-CM | POA: Diagnosis not present

## 2020-12-15 DIAGNOSIS — K219 Gastro-esophageal reflux disease without esophagitis: Secondary | ICD-10-CM | POA: Diagnosis not present

## 2020-12-15 DIAGNOSIS — J209 Acute bronchitis, unspecified: Secondary | ICD-10-CM | POA: Diagnosis not present

## 2020-12-15 DIAGNOSIS — F32A Depression, unspecified: Secondary | ICD-10-CM | POA: Diagnosis not present

## 2020-12-15 DIAGNOSIS — Z85820 Personal history of malignant melanoma of skin: Secondary | ICD-10-CM | POA: Diagnosis not present

## 2020-12-15 DIAGNOSIS — H409 Unspecified glaucoma: Secondary | ICD-10-CM | POA: Diagnosis not present

## 2020-12-15 DIAGNOSIS — E871 Hypo-osmolality and hyponatremia: Secondary | ICD-10-CM | POA: Diagnosis not present

## 2020-12-15 DIAGNOSIS — I6389 Other cerebral infarction: Secondary | ICD-10-CM | POA: Diagnosis not present

## 2020-12-15 DIAGNOSIS — M1712 Unilateral primary osteoarthritis, left knee: Secondary | ICD-10-CM | POA: Diagnosis not present

## 2020-12-16 DIAGNOSIS — Z85820 Personal history of malignant melanoma of skin: Secondary | ICD-10-CM | POA: Diagnosis not present

## 2020-12-16 DIAGNOSIS — S2242XD Multiple fractures of ribs, left side, subsequent encounter for fracture with routine healing: Secondary | ICD-10-CM | POA: Diagnosis not present

## 2020-12-16 DIAGNOSIS — F32A Depression, unspecified: Secondary | ICD-10-CM | POA: Diagnosis not present

## 2020-12-16 DIAGNOSIS — I10 Essential (primary) hypertension: Secondary | ICD-10-CM | POA: Diagnosis not present

## 2020-12-16 DIAGNOSIS — I493 Ventricular premature depolarization: Secondary | ICD-10-CM | POA: Diagnosis not present

## 2020-12-16 DIAGNOSIS — H409 Unspecified glaucoma: Secondary | ICD-10-CM | POA: Diagnosis not present

## 2020-12-16 DIAGNOSIS — Z853 Personal history of malignant neoplasm of breast: Secondary | ICD-10-CM | POA: Diagnosis not present

## 2020-12-16 DIAGNOSIS — I491 Atrial premature depolarization: Secondary | ICD-10-CM | POA: Diagnosis not present

## 2020-12-16 DIAGNOSIS — K219 Gastro-esophageal reflux disease without esophagitis: Secondary | ICD-10-CM | POA: Diagnosis not present

## 2020-12-16 DIAGNOSIS — J209 Acute bronchitis, unspecified: Secondary | ICD-10-CM | POA: Diagnosis not present

## 2020-12-16 DIAGNOSIS — H547 Unspecified visual loss: Secondary | ICD-10-CM | POA: Diagnosis not present

## 2020-12-16 DIAGNOSIS — E039 Hypothyroidism, unspecified: Secondary | ICD-10-CM | POA: Diagnosis not present

## 2020-12-16 DIAGNOSIS — E871 Hypo-osmolality and hyponatremia: Secondary | ICD-10-CM | POA: Diagnosis not present

## 2020-12-16 DIAGNOSIS — Z9012 Acquired absence of left breast and nipple: Secondary | ICD-10-CM | POA: Diagnosis not present

## 2020-12-16 DIAGNOSIS — Z9181 History of falling: Secondary | ICD-10-CM | POA: Diagnosis not present

## 2020-12-16 DIAGNOSIS — I69354 Hemiplegia and hemiparesis following cerebral infarction affecting left non-dominant side: Secondary | ICD-10-CM | POA: Diagnosis not present

## 2020-12-16 DIAGNOSIS — I471 Supraventricular tachycardia: Secondary | ICD-10-CM | POA: Diagnosis not present

## 2020-12-21 DIAGNOSIS — M1712 Unilateral primary osteoarthritis, left knee: Secondary | ICD-10-CM | POA: Diagnosis not present

## 2020-12-22 DIAGNOSIS — S2242XD Multiple fractures of ribs, left side, subsequent encounter for fracture with routine healing: Secondary | ICD-10-CM | POA: Diagnosis not present

## 2020-12-22 DIAGNOSIS — Z9012 Acquired absence of left breast and nipple: Secondary | ICD-10-CM | POA: Diagnosis not present

## 2020-12-22 DIAGNOSIS — F32A Depression, unspecified: Secondary | ICD-10-CM | POA: Diagnosis not present

## 2020-12-22 DIAGNOSIS — I10 Essential (primary) hypertension: Secondary | ICD-10-CM | POA: Diagnosis not present

## 2020-12-22 DIAGNOSIS — Z853 Personal history of malignant neoplasm of breast: Secondary | ICD-10-CM | POA: Diagnosis not present

## 2020-12-22 DIAGNOSIS — E039 Hypothyroidism, unspecified: Secondary | ICD-10-CM | POA: Diagnosis not present

## 2020-12-22 DIAGNOSIS — Z85820 Personal history of malignant melanoma of skin: Secondary | ICD-10-CM | POA: Diagnosis not present

## 2020-12-22 DIAGNOSIS — I69354 Hemiplegia and hemiparesis following cerebral infarction affecting left non-dominant side: Secondary | ICD-10-CM | POA: Diagnosis not present

## 2020-12-22 DIAGNOSIS — K219 Gastro-esophageal reflux disease without esophagitis: Secondary | ICD-10-CM | POA: Diagnosis not present

## 2020-12-22 DIAGNOSIS — H547 Unspecified visual loss: Secondary | ICD-10-CM | POA: Diagnosis not present

## 2020-12-22 DIAGNOSIS — E871 Hypo-osmolality and hyponatremia: Secondary | ICD-10-CM | POA: Diagnosis not present

## 2020-12-22 DIAGNOSIS — Z9181 History of falling: Secondary | ICD-10-CM | POA: Diagnosis not present

## 2020-12-22 DIAGNOSIS — J209 Acute bronchitis, unspecified: Secondary | ICD-10-CM | POA: Diagnosis not present

## 2020-12-22 DIAGNOSIS — H409 Unspecified glaucoma: Secondary | ICD-10-CM | POA: Diagnosis not present

## 2020-12-24 DIAGNOSIS — Z9012 Acquired absence of left breast and nipple: Secondary | ICD-10-CM | POA: Diagnosis not present

## 2020-12-24 DIAGNOSIS — H409 Unspecified glaucoma: Secondary | ICD-10-CM | POA: Diagnosis not present

## 2020-12-24 DIAGNOSIS — F32A Depression, unspecified: Secondary | ICD-10-CM | POA: Diagnosis not present

## 2020-12-24 DIAGNOSIS — H547 Unspecified visual loss: Secondary | ICD-10-CM | POA: Diagnosis not present

## 2020-12-24 DIAGNOSIS — K219 Gastro-esophageal reflux disease without esophagitis: Secondary | ICD-10-CM | POA: Diagnosis not present

## 2020-12-24 DIAGNOSIS — J209 Acute bronchitis, unspecified: Secondary | ICD-10-CM | POA: Diagnosis not present

## 2020-12-24 DIAGNOSIS — I69354 Hemiplegia and hemiparesis following cerebral infarction affecting left non-dominant side: Secondary | ICD-10-CM | POA: Diagnosis not present

## 2020-12-24 DIAGNOSIS — Z9181 History of falling: Secondary | ICD-10-CM | POA: Diagnosis not present

## 2020-12-24 DIAGNOSIS — I10 Essential (primary) hypertension: Secondary | ICD-10-CM | POA: Diagnosis not present

## 2020-12-24 DIAGNOSIS — E871 Hypo-osmolality and hyponatremia: Secondary | ICD-10-CM | POA: Diagnosis not present

## 2020-12-24 DIAGNOSIS — Z853 Personal history of malignant neoplasm of breast: Secondary | ICD-10-CM | POA: Diagnosis not present

## 2020-12-24 DIAGNOSIS — S2242XD Multiple fractures of ribs, left side, subsequent encounter for fracture with routine healing: Secondary | ICD-10-CM | POA: Diagnosis not present

## 2020-12-24 DIAGNOSIS — E039 Hypothyroidism, unspecified: Secondary | ICD-10-CM | POA: Diagnosis not present

## 2020-12-24 DIAGNOSIS — Z85820 Personal history of malignant melanoma of skin: Secondary | ICD-10-CM | POA: Diagnosis not present

## 2020-12-27 DIAGNOSIS — K219 Gastro-esophageal reflux disease without esophagitis: Secondary | ICD-10-CM | POA: Diagnosis not present

## 2020-12-27 DIAGNOSIS — J209 Acute bronchitis, unspecified: Secondary | ICD-10-CM | POA: Diagnosis not present

## 2020-12-27 DIAGNOSIS — I69354 Hemiplegia and hemiparesis following cerebral infarction affecting left non-dominant side: Secondary | ICD-10-CM | POA: Diagnosis not present

## 2020-12-27 DIAGNOSIS — Z9181 History of falling: Secondary | ICD-10-CM | POA: Diagnosis not present

## 2020-12-27 DIAGNOSIS — H409 Unspecified glaucoma: Secondary | ICD-10-CM | POA: Diagnosis not present

## 2020-12-27 DIAGNOSIS — Z9012 Acquired absence of left breast and nipple: Secondary | ICD-10-CM | POA: Diagnosis not present

## 2020-12-27 DIAGNOSIS — Z853 Personal history of malignant neoplasm of breast: Secondary | ICD-10-CM | POA: Diagnosis not present

## 2020-12-27 DIAGNOSIS — F32A Depression, unspecified: Secondary | ICD-10-CM | POA: Diagnosis not present

## 2020-12-27 DIAGNOSIS — S2242XD Multiple fractures of ribs, left side, subsequent encounter for fracture with routine healing: Secondary | ICD-10-CM | POA: Diagnosis not present

## 2020-12-27 DIAGNOSIS — E039 Hypothyroidism, unspecified: Secondary | ICD-10-CM | POA: Diagnosis not present

## 2020-12-27 DIAGNOSIS — H547 Unspecified visual loss: Secondary | ICD-10-CM | POA: Diagnosis not present

## 2020-12-27 DIAGNOSIS — E871 Hypo-osmolality and hyponatremia: Secondary | ICD-10-CM | POA: Diagnosis not present

## 2020-12-27 DIAGNOSIS — I10 Essential (primary) hypertension: Secondary | ICD-10-CM | POA: Diagnosis not present

## 2020-12-27 DIAGNOSIS — Z85820 Personal history of malignant melanoma of skin: Secondary | ICD-10-CM | POA: Diagnosis not present

## 2020-12-28 DIAGNOSIS — G4700 Insomnia, unspecified: Secondary | ICD-10-CM | POA: Diagnosis not present

## 2020-12-28 DIAGNOSIS — N3001 Acute cystitis with hematuria: Secondary | ICD-10-CM | POA: Diagnosis not present

## 2020-12-28 DIAGNOSIS — R531 Weakness: Secondary | ICD-10-CM | POA: Diagnosis not present

## 2020-12-28 DIAGNOSIS — M25562 Pain in left knee: Secondary | ICD-10-CM | POA: Diagnosis not present

## 2020-12-28 DIAGNOSIS — R300 Dysuria: Secondary | ICD-10-CM | POA: Diagnosis not present

## 2020-12-28 DIAGNOSIS — E871 Hypo-osmolality and hyponatremia: Secondary | ICD-10-CM | POA: Diagnosis not present

## 2020-12-28 DIAGNOSIS — Z6824 Body mass index (BMI) 24.0-24.9, adult: Secondary | ICD-10-CM | POA: Diagnosis not present

## 2020-12-29 DIAGNOSIS — S2242XD Multiple fractures of ribs, left side, subsequent encounter for fracture with routine healing: Secondary | ICD-10-CM | POA: Diagnosis not present

## 2020-12-29 DIAGNOSIS — K219 Gastro-esophageal reflux disease without esophagitis: Secondary | ICD-10-CM | POA: Diagnosis not present

## 2020-12-29 DIAGNOSIS — Z9181 History of falling: Secondary | ICD-10-CM | POA: Diagnosis not present

## 2020-12-29 DIAGNOSIS — F32A Depression, unspecified: Secondary | ICD-10-CM | POA: Diagnosis not present

## 2020-12-29 DIAGNOSIS — J209 Acute bronchitis, unspecified: Secondary | ICD-10-CM | POA: Diagnosis not present

## 2020-12-29 DIAGNOSIS — H409 Unspecified glaucoma: Secondary | ICD-10-CM | POA: Diagnosis not present

## 2020-12-29 DIAGNOSIS — Z853 Personal history of malignant neoplasm of breast: Secondary | ICD-10-CM | POA: Diagnosis not present

## 2020-12-29 DIAGNOSIS — E871 Hypo-osmolality and hyponatremia: Secondary | ICD-10-CM | POA: Diagnosis not present

## 2020-12-29 DIAGNOSIS — H547 Unspecified visual loss: Secondary | ICD-10-CM | POA: Diagnosis not present

## 2020-12-29 DIAGNOSIS — I69354 Hemiplegia and hemiparesis following cerebral infarction affecting left non-dominant side: Secondary | ICD-10-CM | POA: Diagnosis not present

## 2020-12-29 DIAGNOSIS — Z9012 Acquired absence of left breast and nipple: Secondary | ICD-10-CM | POA: Diagnosis not present

## 2020-12-29 DIAGNOSIS — E039 Hypothyroidism, unspecified: Secondary | ICD-10-CM | POA: Diagnosis not present

## 2020-12-29 DIAGNOSIS — I10 Essential (primary) hypertension: Secondary | ICD-10-CM | POA: Diagnosis not present

## 2020-12-29 DIAGNOSIS — Z85820 Personal history of malignant melanoma of skin: Secondary | ICD-10-CM | POA: Diagnosis not present

## 2020-12-31 DIAGNOSIS — Z85820 Personal history of malignant melanoma of skin: Secondary | ICD-10-CM | POA: Diagnosis not present

## 2020-12-31 DIAGNOSIS — F32A Depression, unspecified: Secondary | ICD-10-CM | POA: Diagnosis not present

## 2020-12-31 DIAGNOSIS — Z9012 Acquired absence of left breast and nipple: Secondary | ICD-10-CM | POA: Diagnosis not present

## 2020-12-31 DIAGNOSIS — Z9181 History of falling: Secondary | ICD-10-CM | POA: Diagnosis not present

## 2020-12-31 DIAGNOSIS — I10 Essential (primary) hypertension: Secondary | ICD-10-CM | POA: Diagnosis not present

## 2020-12-31 DIAGNOSIS — H409 Unspecified glaucoma: Secondary | ICD-10-CM | POA: Diagnosis not present

## 2020-12-31 DIAGNOSIS — K219 Gastro-esophageal reflux disease without esophagitis: Secondary | ICD-10-CM | POA: Diagnosis not present

## 2020-12-31 DIAGNOSIS — E039 Hypothyroidism, unspecified: Secondary | ICD-10-CM | POA: Diagnosis not present

## 2020-12-31 DIAGNOSIS — S2242XD Multiple fractures of ribs, left side, subsequent encounter for fracture with routine healing: Secondary | ICD-10-CM | POA: Diagnosis not present

## 2020-12-31 DIAGNOSIS — I69354 Hemiplegia and hemiparesis following cerebral infarction affecting left non-dominant side: Secondary | ICD-10-CM | POA: Diagnosis not present

## 2020-12-31 DIAGNOSIS — Z853 Personal history of malignant neoplasm of breast: Secondary | ICD-10-CM | POA: Diagnosis not present

## 2020-12-31 DIAGNOSIS — E871 Hypo-osmolality and hyponatremia: Secondary | ICD-10-CM | POA: Diagnosis not present

## 2020-12-31 DIAGNOSIS — H547 Unspecified visual loss: Secondary | ICD-10-CM | POA: Diagnosis not present

## 2020-12-31 DIAGNOSIS — J209 Acute bronchitis, unspecified: Secondary | ICD-10-CM | POA: Diagnosis not present

## 2021-01-03 DIAGNOSIS — G4700 Insomnia, unspecified: Secondary | ICD-10-CM | POA: Diagnosis not present

## 2021-01-03 DIAGNOSIS — M1712 Unilateral primary osteoarthritis, left knee: Secondary | ICD-10-CM | POA: Diagnosis not present

## 2021-01-03 DIAGNOSIS — E871 Hypo-osmolality and hyponatremia: Secondary | ICD-10-CM | POA: Diagnosis not present

## 2021-01-03 DIAGNOSIS — S8002XA Contusion of left knee, initial encounter: Secondary | ICD-10-CM | POA: Diagnosis not present

## 2021-01-03 DIAGNOSIS — M23322 Other meniscus derangements, posterior horn of medial meniscus, left knee: Secondary | ICD-10-CM | POA: Diagnosis not present

## 2021-01-03 DIAGNOSIS — N3281 Overactive bladder: Secondary | ICD-10-CM | POA: Diagnosis not present

## 2021-01-04 DIAGNOSIS — I639 Cerebral infarction, unspecified: Secondary | ICD-10-CM | POA: Diagnosis not present

## 2021-01-04 DIAGNOSIS — Z85820 Personal history of malignant melanoma of skin: Secondary | ICD-10-CM | POA: Diagnosis not present

## 2021-01-04 DIAGNOSIS — Z853 Personal history of malignant neoplasm of breast: Secondary | ICD-10-CM | POA: Diagnosis not present

## 2021-01-04 DIAGNOSIS — K219 Gastro-esophageal reflux disease without esophagitis: Secondary | ICD-10-CM | POA: Diagnosis not present

## 2021-01-04 DIAGNOSIS — F32A Depression, unspecified: Secondary | ICD-10-CM | POA: Diagnosis not present

## 2021-01-04 DIAGNOSIS — H547 Unspecified visual loss: Secondary | ICD-10-CM | POA: Diagnosis not present

## 2021-01-04 DIAGNOSIS — J209 Acute bronchitis, unspecified: Secondary | ICD-10-CM | POA: Diagnosis not present

## 2021-01-04 DIAGNOSIS — Z9181 History of falling: Secondary | ICD-10-CM | POA: Diagnosis not present

## 2021-01-04 DIAGNOSIS — I10 Essential (primary) hypertension: Secondary | ICD-10-CM | POA: Diagnosis not present

## 2021-01-04 DIAGNOSIS — S2242XD Multiple fractures of ribs, left side, subsequent encounter for fracture with routine healing: Secondary | ICD-10-CM | POA: Diagnosis not present

## 2021-01-04 DIAGNOSIS — Z9012 Acquired absence of left breast and nipple: Secondary | ICD-10-CM | POA: Diagnosis not present

## 2021-01-04 DIAGNOSIS — H3321 Serous retinal detachment, right eye: Secondary | ICD-10-CM | POA: Diagnosis not present

## 2021-01-04 DIAGNOSIS — E039 Hypothyroidism, unspecified: Secondary | ICD-10-CM | POA: Diagnosis not present

## 2021-01-04 DIAGNOSIS — H409 Unspecified glaucoma: Secondary | ICD-10-CM | POA: Diagnosis not present

## 2021-01-04 DIAGNOSIS — I69354 Hemiplegia and hemiparesis following cerebral infarction affecting left non-dominant side: Secondary | ICD-10-CM | POA: Diagnosis not present

## 2021-01-04 DIAGNOSIS — E871 Hypo-osmolality and hyponatremia: Secondary | ICD-10-CM | POA: Diagnosis not present

## 2021-01-05 DIAGNOSIS — Z01818 Encounter for other preprocedural examination: Secondary | ICD-10-CM | POA: Diagnosis not present

## 2021-01-05 DIAGNOSIS — H33001 Unspecified retinal detachment with retinal break, right eye: Secondary | ICD-10-CM | POA: Diagnosis not present

## 2021-01-11 DIAGNOSIS — I69354 Hemiplegia and hemiparesis following cerebral infarction affecting left non-dominant side: Secondary | ICD-10-CM | POA: Diagnosis not present

## 2021-01-14 DIAGNOSIS — I10 Essential (primary) hypertension: Secondary | ICD-10-CM | POA: Diagnosis not present

## 2021-01-14 DIAGNOSIS — E871 Hypo-osmolality and hyponatremia: Secondary | ICD-10-CM | POA: Diagnosis not present

## 2021-01-14 DIAGNOSIS — H3321 Serous retinal detachment, right eye: Secondary | ICD-10-CM | POA: Diagnosis not present

## 2021-01-14 DIAGNOSIS — N3090 Cystitis, unspecified without hematuria: Secondary | ICD-10-CM | POA: Diagnosis not present

## 2021-01-14 DIAGNOSIS — K219 Gastro-esophageal reflux disease without esophagitis: Secondary | ICD-10-CM | POA: Diagnosis not present

## 2021-01-14 DIAGNOSIS — E039 Hypothyroidism, unspecified: Secondary | ICD-10-CM | POA: Diagnosis not present

## 2021-01-14 DIAGNOSIS — Z6824 Body mass index (BMI) 24.0-24.9, adult: Secondary | ICD-10-CM | POA: Diagnosis not present

## 2021-01-21 DIAGNOSIS — Z853 Personal history of malignant neoplasm of breast: Secondary | ICD-10-CM | POA: Diagnosis not present

## 2021-01-21 DIAGNOSIS — I69354 Hemiplegia and hemiparesis following cerebral infarction affecting left non-dominant side: Secondary | ICD-10-CM | POA: Diagnosis not present

## 2021-01-21 DIAGNOSIS — K219 Gastro-esophageal reflux disease without esophagitis: Secondary | ICD-10-CM | POA: Diagnosis not present

## 2021-01-21 DIAGNOSIS — Z9181 History of falling: Secondary | ICD-10-CM | POA: Diagnosis not present

## 2021-01-21 DIAGNOSIS — E039 Hypothyroidism, unspecified: Secondary | ICD-10-CM | POA: Diagnosis not present

## 2021-01-21 DIAGNOSIS — J209 Acute bronchitis, unspecified: Secondary | ICD-10-CM | POA: Diagnosis not present

## 2021-01-21 DIAGNOSIS — F32A Depression, unspecified: Secondary | ICD-10-CM | POA: Diagnosis not present

## 2021-01-21 DIAGNOSIS — Z85820 Personal history of malignant melanoma of skin: Secondary | ICD-10-CM | POA: Diagnosis not present

## 2021-01-21 DIAGNOSIS — Z9012 Acquired absence of left breast and nipple: Secondary | ICD-10-CM | POA: Diagnosis not present

## 2021-01-21 DIAGNOSIS — E871 Hypo-osmolality and hyponatremia: Secondary | ICD-10-CM | POA: Diagnosis not present

## 2021-01-21 DIAGNOSIS — S2242XD Multiple fractures of ribs, left side, subsequent encounter for fracture with routine healing: Secondary | ICD-10-CM | POA: Diagnosis not present

## 2021-01-21 DIAGNOSIS — H547 Unspecified visual loss: Secondary | ICD-10-CM | POA: Diagnosis not present

## 2021-01-21 DIAGNOSIS — H409 Unspecified glaucoma: Secondary | ICD-10-CM | POA: Diagnosis not present

## 2021-01-21 DIAGNOSIS — I10 Essential (primary) hypertension: Secondary | ICD-10-CM | POA: Diagnosis not present

## 2021-01-25 DIAGNOSIS — Z85820 Personal history of malignant melanoma of skin: Secondary | ICD-10-CM | POA: Diagnosis not present

## 2021-01-25 DIAGNOSIS — Z853 Personal history of malignant neoplasm of breast: Secondary | ICD-10-CM | POA: Diagnosis not present

## 2021-01-25 DIAGNOSIS — F32A Depression, unspecified: Secondary | ICD-10-CM | POA: Diagnosis not present

## 2021-01-25 DIAGNOSIS — E871 Hypo-osmolality and hyponatremia: Secondary | ICD-10-CM | POA: Diagnosis not present

## 2021-01-25 DIAGNOSIS — K219 Gastro-esophageal reflux disease without esophagitis: Secondary | ICD-10-CM | POA: Diagnosis not present

## 2021-01-25 DIAGNOSIS — H409 Unspecified glaucoma: Secondary | ICD-10-CM | POA: Diagnosis not present

## 2021-01-25 DIAGNOSIS — H547 Unspecified visual loss: Secondary | ICD-10-CM | POA: Diagnosis not present

## 2021-01-25 DIAGNOSIS — J209 Acute bronchitis, unspecified: Secondary | ICD-10-CM | POA: Diagnosis not present

## 2021-01-25 DIAGNOSIS — Z9181 History of falling: Secondary | ICD-10-CM | POA: Diagnosis not present

## 2021-01-25 DIAGNOSIS — I10 Essential (primary) hypertension: Secondary | ICD-10-CM | POA: Diagnosis not present

## 2021-01-25 DIAGNOSIS — Z9012 Acquired absence of left breast and nipple: Secondary | ICD-10-CM | POA: Diagnosis not present

## 2021-01-25 DIAGNOSIS — I69354 Hemiplegia and hemiparesis following cerebral infarction affecting left non-dominant side: Secondary | ICD-10-CM | POA: Diagnosis not present

## 2021-01-25 DIAGNOSIS — E039 Hypothyroidism, unspecified: Secondary | ICD-10-CM | POA: Diagnosis not present

## 2021-01-25 DIAGNOSIS — S2242XD Multiple fractures of ribs, left side, subsequent encounter for fracture with routine healing: Secondary | ICD-10-CM | POA: Diagnosis not present

## 2021-01-27 DIAGNOSIS — I10 Essential (primary) hypertension: Secondary | ICD-10-CM | POA: Diagnosis not present

## 2021-01-27 DIAGNOSIS — Z85820 Personal history of malignant melanoma of skin: Secondary | ICD-10-CM | POA: Diagnosis not present

## 2021-01-27 DIAGNOSIS — G4700 Insomnia, unspecified: Secondary | ICD-10-CM | POA: Diagnosis not present

## 2021-01-27 DIAGNOSIS — H409 Unspecified glaucoma: Secondary | ICD-10-CM | POA: Diagnosis not present

## 2021-01-27 DIAGNOSIS — E039 Hypothyroidism, unspecified: Secondary | ICD-10-CM | POA: Diagnosis not present

## 2021-01-27 DIAGNOSIS — Z853 Personal history of malignant neoplasm of breast: Secondary | ICD-10-CM | POA: Diagnosis not present

## 2021-01-27 DIAGNOSIS — Z9181 History of falling: Secondary | ICD-10-CM | POA: Diagnosis not present

## 2021-01-27 DIAGNOSIS — J209 Acute bronchitis, unspecified: Secondary | ICD-10-CM | POA: Diagnosis not present

## 2021-01-27 DIAGNOSIS — I69354 Hemiplegia and hemiparesis following cerebral infarction affecting left non-dominant side: Secondary | ICD-10-CM | POA: Diagnosis not present

## 2021-01-27 DIAGNOSIS — E871 Hypo-osmolality and hyponatremia: Secondary | ICD-10-CM | POA: Diagnosis not present

## 2021-01-27 DIAGNOSIS — F32A Depression, unspecified: Secondary | ICD-10-CM | POA: Diagnosis not present

## 2021-01-27 DIAGNOSIS — H547 Unspecified visual loss: Secondary | ICD-10-CM | POA: Diagnosis not present

## 2021-01-27 DIAGNOSIS — Z6824 Body mass index (BMI) 24.0-24.9, adult: Secondary | ICD-10-CM | POA: Diagnosis not present

## 2021-01-27 DIAGNOSIS — S2242XD Multiple fractures of ribs, left side, subsequent encounter for fracture with routine healing: Secondary | ICD-10-CM | POA: Diagnosis not present

## 2021-01-27 DIAGNOSIS — K219 Gastro-esophageal reflux disease without esophagitis: Secondary | ICD-10-CM | POA: Diagnosis not present

## 2021-01-27 DIAGNOSIS — Z9012 Acquired absence of left breast and nipple: Secondary | ICD-10-CM | POA: Diagnosis not present

## 2021-02-01 DIAGNOSIS — Z9181 History of falling: Secondary | ICD-10-CM | POA: Diagnosis not present

## 2021-02-01 DIAGNOSIS — K219 Gastro-esophageal reflux disease without esophagitis: Secondary | ICD-10-CM | POA: Diagnosis not present

## 2021-02-01 DIAGNOSIS — H547 Unspecified visual loss: Secondary | ICD-10-CM | POA: Diagnosis not present

## 2021-02-01 DIAGNOSIS — E039 Hypothyroidism, unspecified: Secondary | ICD-10-CM | POA: Diagnosis not present

## 2021-02-01 DIAGNOSIS — S2242XD Multiple fractures of ribs, left side, subsequent encounter for fracture with routine healing: Secondary | ICD-10-CM | POA: Diagnosis not present

## 2021-02-01 DIAGNOSIS — Z853 Personal history of malignant neoplasm of breast: Secondary | ICD-10-CM | POA: Diagnosis not present

## 2021-02-01 DIAGNOSIS — I69354 Hemiplegia and hemiparesis following cerebral infarction affecting left non-dominant side: Secondary | ICD-10-CM | POA: Diagnosis not present

## 2021-02-01 DIAGNOSIS — Z9012 Acquired absence of left breast and nipple: Secondary | ICD-10-CM | POA: Diagnosis not present

## 2021-02-01 DIAGNOSIS — H409 Unspecified glaucoma: Secondary | ICD-10-CM | POA: Diagnosis not present

## 2021-02-01 DIAGNOSIS — E871 Hypo-osmolality and hyponatremia: Secondary | ICD-10-CM | POA: Diagnosis not present

## 2021-02-01 DIAGNOSIS — F32A Depression, unspecified: Secondary | ICD-10-CM | POA: Diagnosis not present

## 2021-02-01 DIAGNOSIS — J209 Acute bronchitis, unspecified: Secondary | ICD-10-CM | POA: Diagnosis not present

## 2021-02-01 DIAGNOSIS — I10 Essential (primary) hypertension: Secondary | ICD-10-CM | POA: Diagnosis not present

## 2021-02-01 DIAGNOSIS — Z85820 Personal history of malignant melanoma of skin: Secondary | ICD-10-CM | POA: Diagnosis not present

## 2021-02-02 DIAGNOSIS — H409 Unspecified glaucoma: Secondary | ICD-10-CM | POA: Diagnosis not present

## 2021-02-02 DIAGNOSIS — I69354 Hemiplegia and hemiparesis following cerebral infarction affecting left non-dominant side: Secondary | ICD-10-CM | POA: Diagnosis not present

## 2021-02-02 DIAGNOSIS — E039 Hypothyroidism, unspecified: Secondary | ICD-10-CM | POA: Diagnosis not present

## 2021-02-02 DIAGNOSIS — H547 Unspecified visual loss: Secondary | ICD-10-CM | POA: Diagnosis not present

## 2021-02-02 DIAGNOSIS — F32A Depression, unspecified: Secondary | ICD-10-CM | POA: Diagnosis not present

## 2021-02-02 DIAGNOSIS — Z85820 Personal history of malignant melanoma of skin: Secondary | ICD-10-CM | POA: Diagnosis not present

## 2021-02-02 DIAGNOSIS — Z9012 Acquired absence of left breast and nipple: Secondary | ICD-10-CM | POA: Diagnosis not present

## 2021-02-02 DIAGNOSIS — Z853 Personal history of malignant neoplasm of breast: Secondary | ICD-10-CM | POA: Diagnosis not present

## 2021-02-02 DIAGNOSIS — I10 Essential (primary) hypertension: Secondary | ICD-10-CM | POA: Diagnosis not present

## 2021-02-02 DIAGNOSIS — E871 Hypo-osmolality and hyponatremia: Secondary | ICD-10-CM | POA: Diagnosis not present

## 2021-02-02 DIAGNOSIS — K219 Gastro-esophageal reflux disease without esophagitis: Secondary | ICD-10-CM | POA: Diagnosis not present

## 2021-02-02 DIAGNOSIS — Z9181 History of falling: Secondary | ICD-10-CM | POA: Diagnosis not present

## 2021-02-02 DIAGNOSIS — J209 Acute bronchitis, unspecified: Secondary | ICD-10-CM | POA: Diagnosis not present

## 2021-02-02 DIAGNOSIS — S2242XD Multiple fractures of ribs, left side, subsequent encounter for fracture with routine healing: Secondary | ICD-10-CM | POA: Diagnosis not present

## 2021-02-03 DIAGNOSIS — F32A Depression, unspecified: Secondary | ICD-10-CM | POA: Diagnosis not present

## 2021-02-03 DIAGNOSIS — J209 Acute bronchitis, unspecified: Secondary | ICD-10-CM | POA: Diagnosis not present

## 2021-02-03 DIAGNOSIS — E039 Hypothyroidism, unspecified: Secondary | ICD-10-CM | POA: Diagnosis not present

## 2021-02-03 DIAGNOSIS — Z9012 Acquired absence of left breast and nipple: Secondary | ICD-10-CM | POA: Diagnosis not present

## 2021-02-03 DIAGNOSIS — E871 Hypo-osmolality and hyponatremia: Secondary | ICD-10-CM | POA: Diagnosis not present

## 2021-02-03 DIAGNOSIS — I69354 Hemiplegia and hemiparesis following cerebral infarction affecting left non-dominant side: Secondary | ICD-10-CM | POA: Diagnosis not present

## 2021-02-03 DIAGNOSIS — S2242XD Multiple fractures of ribs, left side, subsequent encounter for fracture with routine healing: Secondary | ICD-10-CM | POA: Diagnosis not present

## 2021-02-03 DIAGNOSIS — Z853 Personal history of malignant neoplasm of breast: Secondary | ICD-10-CM | POA: Diagnosis not present

## 2021-02-03 DIAGNOSIS — I639 Cerebral infarction, unspecified: Secondary | ICD-10-CM | POA: Diagnosis not present

## 2021-02-03 DIAGNOSIS — H409 Unspecified glaucoma: Secondary | ICD-10-CM | POA: Diagnosis not present

## 2021-02-03 DIAGNOSIS — I10 Essential (primary) hypertension: Secondary | ICD-10-CM | POA: Diagnosis not present

## 2021-02-03 DIAGNOSIS — H547 Unspecified visual loss: Secondary | ICD-10-CM | POA: Diagnosis not present

## 2021-02-03 DIAGNOSIS — Z9181 History of falling: Secondary | ICD-10-CM | POA: Diagnosis not present

## 2021-02-03 DIAGNOSIS — K219 Gastro-esophageal reflux disease without esophagitis: Secondary | ICD-10-CM | POA: Diagnosis not present

## 2021-02-03 DIAGNOSIS — Z85820 Personal history of malignant melanoma of skin: Secondary | ICD-10-CM | POA: Diagnosis not present

## 2021-02-11 NOTE — Progress Notes (Incomplete)
[facility]  [address] [place],  [place]  [address] [phone]  Clinic Day:  02/11/2021  Referring physician: Venetia Maxon, Sharon Mt, *   This document serves as a record of services personally performed by Hosie Poisson, MD. It was created on their behalf by Curry,Lauren E, a trained medical scribe. The creation of this record is based on the scribe's personal observations and the provider's statements to them.  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  She was treated with lumpectomy.  Pathology revealed a 0.5 cm invasive ductal carcinoma in the background of high-grade ductal carcinoma in situ with a negative sentinel node.  Estrogen and progesterone receptors were positive and HER 2 negative.  Adjuvant chemotherapy was not recommended.  She did received postoperative radiation to the left breast and then was placed on anastrozole 1 mg daily in May 2012. She to not tolerate anastrozole due to malaise, so was switched to letrozole and did not tolerate that either.  Eventually she refused all hormonal therapy.  She underwent cholecystectomy in December 2016 and was in the hospital for 7 days.    We scheduled her for mammogram in February 2019, which revealed 2 suspicious masses in the right breast and biopsy was recommended.  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  The imaging appeared to be a 1.5 cm lesion.  Estrogen and progesterone receptors were highly positive with HER 2 negative, and a Ki 67 of 25%.  A second biopsy at 1 o'clock was benign, but felt to be discordant.  We had been  concerned about her memory.  She underwent right mastectomy in April 2019.  Pathology revealed a multicentric invasive ductal carcinoma with 5 negative nodes.  One lesion was grade 3 and 15 mm in size, and the other was grade 1 and 13 mm lesion in size, for a stage IA (T1c N0 M0) hormone receptor positive right breast cancer.  Bone density scan in June 2019 revealed worsening osteopenia of the femur for a T-score of -2.4 and stable osteopenia of the spine with a T-score of -1.9.  She was placed on adjuvant hormonal therapy with letrozole 2.5 mg in June 2019. She initially tolerated this fairly well, but later stopped it on her own.  In August 2019, she develops to small nodules of the right mastectomy site.  Biopsy of both of these in September 2019 revealed fat necrosis.  Bone density scan from June revealed osteoporosis with a T-score of -2.7 of the right femur, previously -2.2, and a T-score of-2.4 in the spine, previously -1.9.  Left screening mammogram in July did not reveal any evidence of malignancy. Due to the osteoporosis, we recommended that she be placed on Prolia every 6 months and she received her 1st dose on July 19th.  She has been on and off hormonal therapy with letrozole and anastrozole, but currently is compliant.    INTERVAL HISTORY:  Micaela is here for routine follow up prior to her next Prolia.  However she declines to continue this for now.  She has had 2 doses and complains of right jaw pain and cramping.  She continues anastrozole daily without significant difficulty.  She had her annual left mammogram on 7/8 and this was clear. She states that she has been fairly well now but experienced an episode of syncope and was evaluated with finding of hyponatremia.  The brain CT showed only mild atrophy.  Blood counts and chemistries are unremarkable except for a sodium of 133, stable.  Her  appetite is good, and she has lost 1 1/2 pounds since her last visit.  She denies fever, chills or other  signs of infection.  She denies nausea, vomiting, bowel issues, or abdominal pain.  She denies sore throat, cough, dyspnea, or chest pain.  [name] is here for routine follow up ***. She continues anastrozole daily without significant difficulty.     Her  appetite is good, and she has gained/lost _ pounds since her last visit.  She denies fever, chills or other signs of infection.  She denies nausea, vomiting, bowel issues, or abdominal pain.  She denies sore throat, cough, dyspnea, or chest pain.  REVIEW OF SYSTEMS:  Review of Systems  Constitutional: Negative.  Negative for appetite change, chills, fatigue, fever and unexpected weight change.  HENT:  Negative.    Eyes: Negative.   Respiratory: Negative.  Negative for chest tightness, cough, hemoptysis, shortness of breath and wheezing.   Cardiovascular: Negative.  Negative for chest pain, leg swelling and palpitations.  Gastrointestinal: Negative.  Negative for abdominal distention, abdominal pain, blood in stool, constipation, diarrhea, nausea and vomiting.  Endocrine: Negative.   Genitourinary: Negative.  Negative for difficulty urinating, dysuria, frequency and hematuria.   Musculoskeletal: Negative.  Negative for arthralgias, back pain, flank pain, gait problem and myalgias.  Skin: Negative.   Neurological: Negative.  Negative for dizziness, extremity weakness, gait problem, headaches, light-headedness, numbness, seizures and speech difficulty.  Hematological: Negative.   Psychiatric/Behavioral: Negative.  Negative for depression and sleep disturbance. The patient is not nervous/anxious.     VITALS:  There were no vitals taken for this visit.  Wt Readings from Last 3 Encounters:  08/16/20 146 lb 8 oz (66.5 kg)  04/22/20 148 lb 6 oz (67.3 kg)  02/13/20 149 lb 7 oz (67.8 kg)    There is no height or weight on file to calculate BMI.  Performance status (ECOG): 1 - Symptomatic but completely ambulatory  PHYSICAL EXAM:  Physical  Exam Constitutional:      General: She is not in acute distress.    Appearance: Normal appearance. She is normal weight.  HENT:     Head: Normocephalic and atraumatic.  Eyes:     General: No scleral icterus.    Extraocular Movements: Extraocular movements intact.     Conjunctiva/sclera: Conjunctivae normal.     Pupils: Pupils are equal, round, and reactive to light.  Cardiovascular:     Rate and Rhythm: Normal rate and regular rhythm.     Pulses: Normal pulses.     Heart sounds: Normal heart sounds. No murmur heard.   No friction rub. No gallop.  Pulmonary:     Effort: Pulmonary effort is normal. No respiratory distress.     Breath sounds: Normal breath sounds.  Abdominal:     General: Bowel sounds are normal. There is no distension.     Palpations: Abdomen is soft. There is no hepatomegaly, splenomegaly or mass.     Tenderness: There is no abdominal tenderness.  Musculoskeletal:        General: Normal range of motion.     Cervical back: Normal  range of motion and neck supple.     Right lower leg: No edema.     Left lower leg: No edema.  Lymphadenopathy:     Cervical: No cervical adenopathy.  Skin:    General: Skin is warm and dry.  Neurological:     General: No focal deficit present.     Mental Status: She is alert and oriented to person, place, and time. Mental status is at baseline.  Psychiatric:        Mood and Affect: Mood normal.        Behavior: Behavior normal.        Thought Content: Thought content normal.        Judgment: Judgment normal.    LABS:   CBC Latest Ref Rng & Units 08/16/2020 02/11/2020  WBC - 8.0 7.1  Hemoglobin 12.0 - 16.0 12.6 12.9  Hematocrit 36 - 46 37 38  Platelets 150 - 399 348 382   CMP Latest Ref Rng & Units 08/16/2020 04/29/2020 02/11/2020  BUN 4 - 21 15 - 17  Creatinine 0.5 - 1.1 0.9 0.90 1.2(A)  Sodium 137 - 147 133(A) - 133(A)  Potassium 3.4 - 5.3 4.4 - 4.1  Chloride 99 - 108 101 - 98(A)  CO2 13 - 22 23(A) - 26(A)  Calcium 8.7 -  10.7 8.6(A) - 9.4  Alkaline Phos 25 - 125 54 - 55  AST 13 - 35 28 - 28  ALT 7 - 35 17 - 18    STUDIES:    Allergies:  Allergies  Allergen Reactions   Cefdinir Nausea Only   Hydrocodone-Acetaminophen Other (See Comments)   Latuda [Lurasidone]    Prednisone    Codeine Other (See Comments) and Rash   Penicillins Rash and Swelling   Sulfa Antibiotics Rash    Current Medications: Current Outpatient Medications  Medication Sig Dispense Refill   ALPRAZolam (XANAX) 0.25 MG tablet Take 0.5-1 tablets by mouth as needed for anxiety.     amLODipine (NORVASC) 2.5 MG tablet Take 2.5 mg by mouth daily.     anastrozole (ARIMIDEX) 1 MG tablet Take 1 tablet (1 mg total) by mouth daily. 90 tablet 3   aspirin EC 81 MG tablet Take 81 mg by mouth daily.     atorvastatin (LIPITOR) 20 MG tablet Take 20 mg by mouth daily.     calcium-vitamin D (OSCAL WITH D) 250-125 MG-UNIT tablet Take 2 tablets by mouth 2 (two) times daily. Per Dr. Hinton Rao patient is to take 2 tablet twice a day     carteolol (OCUPRESS) 1 % ophthalmic solution Place 1 drop into the left eye 2 (two) times daily.     citalopram (CELEXA) 20 MG tablet Take 20 mg by mouth daily.     co-enzyme Q-10 30 MG capsule Take 30 mg by mouth daily.     denosumab (PROLIA) 60 MG/ML SOSY injection Inject 60 mg into the skin every 6 (six) months.     dorzolamide (TRUSOPT) 2 % ophthalmic solution Place 1 drop into the left eye 2 (two) times daily.     famotidine (PEPCID) 40 MG tablet Take 1 tablet (40 mg total) by mouth at bedtime. Please call [phone] to let us know how you are doing on this medication 30 tablet 1   Fluoxetine HCl, PMDD, 10 MG TABS Take 10 mg by mouth daily.     gabapentin (NEURONTIN) 100 MG capsule Take 100 mg by mouth as needed for pain.     isosorbide mononitrate (  IMDUR) 30 MG 24 hr tablet Take 30 mg by mouth daily.     levothyroxine (SYNTHROID) 75 MCG tablet Take 75 mcg by mouth daily before breakfast.     lisinopril (ZESTRIL)  10 MG tablet Take 10 mg by mouth daily.     lisinopril-hydrochlorothiazide (ZESTORETIC) 10-12.5 MG tablet Take 1 tablet by mouth daily.     loratadine (CLARITIN) 10 MG tablet Take 20 mg by mouth daily with breakfast.     MAGNESIUM PO Take 2 tablets by mouth daily.     metoprolol tartrate (LOPRESSOR) 25 MG tablet Take 25 mg by mouth 2 (two) times daily.     Multiple Minerals-Vitamins (CALCIUM & VIT D3 BONE HEALTH PO) Take 2 tablets by mouth daily. Chewables     mupirocin ointment (BACTROBAN) 2 % Place 1 application into the nose 2 (two) times daily.     nitroGLYCERIN (NITROSTAT) 0.4 MG SL tablet Place 0.4 mg under the tongue as needed for chest pain.     pantoprazole (PROTONIX) 40 MG tablet Take 1 tablet (40 mg total) by mouth 2 (two) times daily. 60 tablet 3   potassium chloride SA (KLOR-CON) 20 MEQ tablet Take 20 mEq by mouth daily.     sucralfate (CARAFATE) 1 g tablet Take 1 g by mouth 2 (two) times daily.     triamcinolone (KENALOG) 0.1 % Apply 1 application topically 2 (two) times daily as needed for rash.     No current facility-administered medications for this visit.     ASSESSMENT & PLAN:   Assessment:   1. History of stage IA hormone receptor positive left breast cancer December 2011 treated with lumpectomy and postoperative radiation, but she was unable to tolerate hormonal therapy.    2. 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  She has been on and off adjuvant hormonal therapy, due to difficulty tolerating it.  She has resumed anastrozole for the last 6 months without difficulty.  3. Osteoporosis, for which she was started on Prolia every 6 months in July 2021.  She has received 2 doses but wishes to stop it for now.  Her last DEXA from June 2021 had shown progression from osteopenia to osteoporosis, but not severe, with a T score of 2.7.  We will hold this until her next bone density scan, which  would be due in 6 months.  I advised her to increase her calcium supplement to 2 pills twice daily, i.e. double the dose, since her calcium is only 8.6.  4. Small soft right axillary lipoma.  5. Nodules of the right mastectomy incision.  Biopsies revealed fat necrosis. She just has a mild firmness there now.  6. Chronic hyponatremia with apparent worsening this spring, very symptomatic.    Plan: She knows to continue anastrozole daily, and is tolerating this without difficulty at this time.  We will see her back in 6 months with a CBC, comprehensive metabolic panel and TSH for repeat examination.  We will repeat her left mammogram and DEXA in 6 months. The patient understands the plans discussed today and is in agreement with them.  She knows to contact our office if she develops concerns regarding her breast cancer or its treatment.   I provided 20 minutes of face-to-face time during this this encounter and > 50% was spent counseling as documented under my assessment and plan.    Derwood Kaplan, MD [place]  [place] [address] Dept: [phone] Dept Fax: [phone]

## 2021-02-15 DIAGNOSIS — Z853 Personal history of malignant neoplasm of breast: Secondary | ICD-10-CM | POA: Diagnosis not present

## 2021-02-15 DIAGNOSIS — Z9181 History of falling: Secondary | ICD-10-CM | POA: Diagnosis not present

## 2021-02-15 DIAGNOSIS — E871 Hypo-osmolality and hyponatremia: Secondary | ICD-10-CM | POA: Diagnosis not present

## 2021-02-15 DIAGNOSIS — Z85820 Personal history of malignant melanoma of skin: Secondary | ICD-10-CM | POA: Diagnosis not present

## 2021-02-15 DIAGNOSIS — F32A Depression, unspecified: Secondary | ICD-10-CM | POA: Diagnosis not present

## 2021-02-15 DIAGNOSIS — H409 Unspecified glaucoma: Secondary | ICD-10-CM | POA: Diagnosis not present

## 2021-02-15 DIAGNOSIS — E039 Hypothyroidism, unspecified: Secondary | ICD-10-CM | POA: Diagnosis not present

## 2021-02-15 DIAGNOSIS — I69354 Hemiplegia and hemiparesis following cerebral infarction affecting left non-dominant side: Secondary | ICD-10-CM | POA: Diagnosis not present

## 2021-02-15 DIAGNOSIS — J209 Acute bronchitis, unspecified: Secondary | ICD-10-CM | POA: Diagnosis not present

## 2021-02-15 DIAGNOSIS — Z9012 Acquired absence of left breast and nipple: Secondary | ICD-10-CM | POA: Diagnosis not present

## 2021-02-15 DIAGNOSIS — S2242XD Multiple fractures of ribs, left side, subsequent encounter for fracture with routine healing: Secondary | ICD-10-CM | POA: Diagnosis not present

## 2021-02-15 DIAGNOSIS — I10 Essential (primary) hypertension: Secondary | ICD-10-CM | POA: Diagnosis not present

## 2021-02-15 DIAGNOSIS — H547 Unspecified visual loss: Secondary | ICD-10-CM | POA: Diagnosis not present

## 2021-02-15 DIAGNOSIS — K219 Gastro-esophageal reflux disease without esophagitis: Secondary | ICD-10-CM | POA: Diagnosis not present

## 2021-02-16 ENCOUNTER — Other Ambulatory Visit: Payer: Medicare Other

## 2021-02-16 ENCOUNTER — Ambulatory Visit: Payer: Medicare Other | Admitting: Oncology

## 2021-02-16 DIAGNOSIS — Z853 Personal history of malignant neoplasm of breast: Secondary | ICD-10-CM | POA: Diagnosis not present

## 2021-02-16 DIAGNOSIS — E871 Hypo-osmolality and hyponatremia: Secondary | ICD-10-CM | POA: Diagnosis not present

## 2021-02-16 DIAGNOSIS — H547 Unspecified visual loss: Secondary | ICD-10-CM | POA: Diagnosis not present

## 2021-02-16 DIAGNOSIS — Z9181 History of falling: Secondary | ICD-10-CM | POA: Diagnosis not present

## 2021-02-16 DIAGNOSIS — Z85820 Personal history of malignant melanoma of skin: Secondary | ICD-10-CM | POA: Diagnosis not present

## 2021-02-16 DIAGNOSIS — S2242XD Multiple fractures of ribs, left side, subsequent encounter for fracture with routine healing: Secondary | ICD-10-CM | POA: Diagnosis not present

## 2021-02-16 DIAGNOSIS — J209 Acute bronchitis, unspecified: Secondary | ICD-10-CM | POA: Diagnosis not present

## 2021-02-16 DIAGNOSIS — K219 Gastro-esophageal reflux disease without esophagitis: Secondary | ICD-10-CM | POA: Diagnosis not present

## 2021-02-16 DIAGNOSIS — Z9012 Acquired absence of left breast and nipple: Secondary | ICD-10-CM | POA: Diagnosis not present

## 2021-02-16 DIAGNOSIS — F32A Depression, unspecified: Secondary | ICD-10-CM | POA: Diagnosis not present

## 2021-02-16 DIAGNOSIS — E039 Hypothyroidism, unspecified: Secondary | ICD-10-CM | POA: Diagnosis not present

## 2021-02-16 DIAGNOSIS — I10 Essential (primary) hypertension: Secondary | ICD-10-CM | POA: Diagnosis not present

## 2021-02-16 DIAGNOSIS — H409 Unspecified glaucoma: Secondary | ICD-10-CM | POA: Diagnosis not present

## 2021-02-16 DIAGNOSIS — I69354 Hemiplegia and hemiparesis following cerebral infarction affecting left non-dominant side: Secondary | ICD-10-CM | POA: Diagnosis not present

## 2021-02-21 DIAGNOSIS — J329 Chronic sinusitis, unspecified: Secondary | ICD-10-CM | POA: Diagnosis not present

## 2021-02-21 DIAGNOSIS — J40 Bronchitis, not specified as acute or chronic: Secondary | ICD-10-CM | POA: Diagnosis not present

## 2021-02-21 DIAGNOSIS — H9202 Otalgia, left ear: Secondary | ICD-10-CM | POA: Diagnosis not present

## 2021-02-24 DIAGNOSIS — Z853 Personal history of malignant neoplasm of breast: Secondary | ICD-10-CM | POA: Diagnosis not present

## 2021-02-24 DIAGNOSIS — J209 Acute bronchitis, unspecified: Secondary | ICD-10-CM | POA: Diagnosis not present

## 2021-02-24 DIAGNOSIS — Z9012 Acquired absence of left breast and nipple: Secondary | ICD-10-CM | POA: Diagnosis not present

## 2021-02-24 DIAGNOSIS — K219 Gastro-esophageal reflux disease without esophagitis: Secondary | ICD-10-CM | POA: Diagnosis not present

## 2021-02-24 DIAGNOSIS — Z9181 History of falling: Secondary | ICD-10-CM | POA: Diagnosis not present

## 2021-02-24 DIAGNOSIS — H409 Unspecified glaucoma: Secondary | ICD-10-CM | POA: Diagnosis not present

## 2021-02-24 DIAGNOSIS — F32A Depression, unspecified: Secondary | ICD-10-CM | POA: Diagnosis not present

## 2021-02-24 DIAGNOSIS — E871 Hypo-osmolality and hyponatremia: Secondary | ICD-10-CM | POA: Diagnosis not present

## 2021-02-24 DIAGNOSIS — H547 Unspecified visual loss: Secondary | ICD-10-CM | POA: Diagnosis not present

## 2021-02-24 DIAGNOSIS — I69354 Hemiplegia and hemiparesis following cerebral infarction affecting left non-dominant side: Secondary | ICD-10-CM | POA: Diagnosis not present

## 2021-02-24 DIAGNOSIS — E039 Hypothyroidism, unspecified: Secondary | ICD-10-CM | POA: Diagnosis not present

## 2021-02-24 DIAGNOSIS — S2242XD Multiple fractures of ribs, left side, subsequent encounter for fracture with routine healing: Secondary | ICD-10-CM | POA: Diagnosis not present

## 2021-02-24 DIAGNOSIS — I10 Essential (primary) hypertension: Secondary | ICD-10-CM | POA: Diagnosis not present

## 2021-02-24 DIAGNOSIS — Z85820 Personal history of malignant melanoma of skin: Secondary | ICD-10-CM | POA: Diagnosis not present

## 2021-03-01 DIAGNOSIS — E871 Hypo-osmolality and hyponatremia: Secondary | ICD-10-CM | POA: Diagnosis not present

## 2021-03-01 DIAGNOSIS — I69354 Hemiplegia and hemiparesis following cerebral infarction affecting left non-dominant side: Secondary | ICD-10-CM | POA: Diagnosis not present

## 2021-03-01 DIAGNOSIS — I10 Essential (primary) hypertension: Secondary | ICD-10-CM | POA: Diagnosis not present

## 2021-03-01 DIAGNOSIS — F32A Depression, unspecified: Secondary | ICD-10-CM | POA: Diagnosis not present

## 2021-03-01 DIAGNOSIS — H547 Unspecified visual loss: Secondary | ICD-10-CM | POA: Diagnosis not present

## 2021-03-01 DIAGNOSIS — H409 Unspecified glaucoma: Secondary | ICD-10-CM | POA: Diagnosis not present

## 2021-03-01 DIAGNOSIS — K219 Gastro-esophageal reflux disease without esophagitis: Secondary | ICD-10-CM | POA: Diagnosis not present

## 2021-03-01 DIAGNOSIS — Z85820 Personal history of malignant melanoma of skin: Secondary | ICD-10-CM | POA: Diagnosis not present

## 2021-03-01 DIAGNOSIS — E039 Hypothyroidism, unspecified: Secondary | ICD-10-CM | POA: Diagnosis not present

## 2021-03-01 DIAGNOSIS — S2242XD Multiple fractures of ribs, left side, subsequent encounter for fracture with routine healing: Secondary | ICD-10-CM | POA: Diagnosis not present

## 2021-03-01 DIAGNOSIS — Z853 Personal history of malignant neoplasm of breast: Secondary | ICD-10-CM | POA: Diagnosis not present

## 2021-03-01 DIAGNOSIS — Z9181 History of falling: Secondary | ICD-10-CM | POA: Diagnosis not present

## 2021-03-01 DIAGNOSIS — Z9012 Acquired absence of left breast and nipple: Secondary | ICD-10-CM | POA: Diagnosis not present

## 2021-03-01 DIAGNOSIS — J209 Acute bronchitis, unspecified: Secondary | ICD-10-CM | POA: Diagnosis not present

## 2021-03-03 DIAGNOSIS — Z853 Personal history of malignant neoplasm of breast: Secondary | ICD-10-CM | POA: Diagnosis not present

## 2021-03-03 DIAGNOSIS — I69354 Hemiplegia and hemiparesis following cerebral infarction affecting left non-dominant side: Secondary | ICD-10-CM | POA: Diagnosis not present

## 2021-03-03 DIAGNOSIS — E039 Hypothyroidism, unspecified: Secondary | ICD-10-CM | POA: Diagnosis not present

## 2021-03-03 DIAGNOSIS — Z85820 Personal history of malignant melanoma of skin: Secondary | ICD-10-CM | POA: Diagnosis not present

## 2021-03-03 DIAGNOSIS — E871 Hypo-osmolality and hyponatremia: Secondary | ICD-10-CM | POA: Diagnosis not present

## 2021-03-03 DIAGNOSIS — Z9012 Acquired absence of left breast and nipple: Secondary | ICD-10-CM | POA: Diagnosis not present

## 2021-03-03 DIAGNOSIS — H409 Unspecified glaucoma: Secondary | ICD-10-CM | POA: Diagnosis not present

## 2021-03-03 DIAGNOSIS — F32A Depression, unspecified: Secondary | ICD-10-CM | POA: Diagnosis not present

## 2021-03-03 DIAGNOSIS — K219 Gastro-esophageal reflux disease without esophagitis: Secondary | ICD-10-CM | POA: Diagnosis not present

## 2021-03-03 DIAGNOSIS — S2242XD Multiple fractures of ribs, left side, subsequent encounter for fracture with routine healing: Secondary | ICD-10-CM | POA: Diagnosis not present

## 2021-03-03 DIAGNOSIS — H547 Unspecified visual loss: Secondary | ICD-10-CM | POA: Diagnosis not present

## 2021-03-03 DIAGNOSIS — J209 Acute bronchitis, unspecified: Secondary | ICD-10-CM | POA: Diagnosis not present

## 2021-03-03 DIAGNOSIS — I10 Essential (primary) hypertension: Secondary | ICD-10-CM | POA: Diagnosis not present

## 2021-03-03 DIAGNOSIS — Z9181 History of falling: Secondary | ICD-10-CM | POA: Diagnosis not present

## 2021-03-04 DIAGNOSIS — Z853 Personal history of malignant neoplasm of breast: Secondary | ICD-10-CM | POA: Diagnosis not present

## 2021-03-04 DIAGNOSIS — K219 Gastro-esophageal reflux disease without esophagitis: Secondary | ICD-10-CM | POA: Diagnosis not present

## 2021-03-04 DIAGNOSIS — I69354 Hemiplegia and hemiparesis following cerebral infarction affecting left non-dominant side: Secondary | ICD-10-CM | POA: Diagnosis not present

## 2021-03-04 DIAGNOSIS — Z9181 History of falling: Secondary | ICD-10-CM | POA: Diagnosis not present

## 2021-03-04 DIAGNOSIS — Z9012 Acquired absence of left breast and nipple: Secondary | ICD-10-CM | POA: Diagnosis not present

## 2021-03-04 DIAGNOSIS — H409 Unspecified glaucoma: Secondary | ICD-10-CM | POA: Diagnosis not present

## 2021-03-04 DIAGNOSIS — F32A Depression, unspecified: Secondary | ICD-10-CM | POA: Diagnosis not present

## 2021-03-04 DIAGNOSIS — I10 Essential (primary) hypertension: Secondary | ICD-10-CM | POA: Diagnosis not present

## 2021-03-04 DIAGNOSIS — H547 Unspecified visual loss: Secondary | ICD-10-CM | POA: Diagnosis not present

## 2021-03-04 DIAGNOSIS — E871 Hypo-osmolality and hyponatremia: Secondary | ICD-10-CM | POA: Diagnosis not present

## 2021-03-04 DIAGNOSIS — Z85820 Personal history of malignant melanoma of skin: Secondary | ICD-10-CM | POA: Diagnosis not present

## 2021-03-04 DIAGNOSIS — E039 Hypothyroidism, unspecified: Secondary | ICD-10-CM | POA: Diagnosis not present

## 2021-03-04 DIAGNOSIS — S2242XD Multiple fractures of ribs, left side, subsequent encounter for fracture with routine healing: Secondary | ICD-10-CM | POA: Diagnosis not present

## 2021-03-04 DIAGNOSIS — J209 Acute bronchitis, unspecified: Secondary | ICD-10-CM | POA: Diagnosis not present

## 2021-03-06 DIAGNOSIS — I639 Cerebral infarction, unspecified: Secondary | ICD-10-CM | POA: Diagnosis not present

## 2021-03-08 DIAGNOSIS — S8002XA Contusion of left knee, initial encounter: Secondary | ICD-10-CM | POA: Diagnosis not present

## 2021-03-08 DIAGNOSIS — M23322 Other meniscus derangements, posterior horn of medial meniscus, left knee: Secondary | ICD-10-CM | POA: Diagnosis not present

## 2021-03-08 DIAGNOSIS — M1712 Unilateral primary osteoarthritis, left knee: Secondary | ICD-10-CM | POA: Diagnosis not present

## 2021-03-14 DIAGNOSIS — E039 Hypothyroidism, unspecified: Secondary | ICD-10-CM | POA: Diagnosis not present

## 2021-03-14 DIAGNOSIS — M1712 Unilateral primary osteoarthritis, left knee: Secondary | ICD-10-CM | POA: Diagnosis not present

## 2021-03-14 DIAGNOSIS — Z01818 Encounter for other preprocedural examination: Secondary | ICD-10-CM | POA: Diagnosis not present

## 2021-03-14 DIAGNOSIS — I672 Cerebral atherosclerosis: Secondary | ICD-10-CM | POA: Diagnosis not present

## 2021-03-14 DIAGNOSIS — I70213 Atherosclerosis of native arteries of extremities with intermittent claudication, bilateral legs: Secondary | ICD-10-CM | POA: Diagnosis not present

## 2021-03-14 DIAGNOSIS — I201 Angina pectoris with documented spasm: Secondary | ICD-10-CM | POA: Diagnosis not present

## 2021-03-14 DIAGNOSIS — M1991 Primary osteoarthritis, unspecified site: Secondary | ICD-10-CM | POA: Diagnosis not present

## 2021-03-14 DIAGNOSIS — I69354 Hemiplegia and hemiparesis following cerebral infarction affecting left non-dominant side: Secondary | ICD-10-CM | POA: Diagnosis not present

## 2021-03-14 DIAGNOSIS — I700 Atherosclerosis of aorta: Secondary | ICD-10-CM | POA: Diagnosis not present

## 2021-03-14 DIAGNOSIS — C50911 Malignant neoplasm of unspecified site of right female breast: Secondary | ICD-10-CM | POA: Diagnosis not present

## 2021-03-21 DIAGNOSIS — H33001 Unspecified retinal detachment with retinal break, right eye: Secondary | ICD-10-CM | POA: Diagnosis not present

## 2021-04-01 DIAGNOSIS — I10 Essential (primary) hypertension: Secondary | ICD-10-CM | POA: Diagnosis not present

## 2021-04-01 DIAGNOSIS — M1712 Unilateral primary osteoarthritis, left knee: Secondary | ICD-10-CM | POA: Diagnosis not present

## 2021-04-01 DIAGNOSIS — M23322 Other meniscus derangements, posterior horn of medial meniscus, left knee: Secondary | ICD-10-CM | POA: Diagnosis not present

## 2021-04-01 DIAGNOSIS — Z8673 Personal history of transient ischemic attack (TIA), and cerebral infarction without residual deficits: Secondary | ICD-10-CM | POA: Diagnosis not present

## 2021-04-01 DIAGNOSIS — M23332 Other meniscus derangements, other medial meniscus, left knee: Secondary | ICD-10-CM | POA: Diagnosis not present

## 2021-04-01 DIAGNOSIS — M2242 Chondromalacia patellae, left knee: Secondary | ICD-10-CM | POA: Diagnosis not present

## 2021-04-01 DIAGNOSIS — S83242A Other tear of medial meniscus, current injury, left knee, initial encounter: Secondary | ICD-10-CM | POA: Diagnosis not present

## 2021-04-01 DIAGNOSIS — M94262 Chondromalacia, left knee: Secondary | ICD-10-CM | POA: Diagnosis not present

## 2021-04-01 DIAGNOSIS — S8002XA Contusion of left knee, initial encounter: Secondary | ICD-10-CM | POA: Diagnosis not present

## 2021-04-04 DIAGNOSIS — Z9012 Acquired absence of left breast and nipple: Secondary | ICD-10-CM | POA: Diagnosis not present

## 2021-04-04 DIAGNOSIS — S8002XD Contusion of left knee, subsequent encounter: Secondary | ICD-10-CM | POA: Diagnosis not present

## 2021-04-04 DIAGNOSIS — I69354 Hemiplegia and hemiparesis following cerebral infarction affecting left non-dominant side: Secondary | ICD-10-CM | POA: Diagnosis not present

## 2021-04-04 DIAGNOSIS — M23322 Other meniscus derangements, posterior horn of medial meniscus, left knee: Secondary | ICD-10-CM | POA: Diagnosis not present

## 2021-04-04 DIAGNOSIS — Z7982 Long term (current) use of aspirin: Secondary | ICD-10-CM | POA: Diagnosis not present

## 2021-04-04 DIAGNOSIS — F32A Depression, unspecified: Secondary | ICD-10-CM | POA: Diagnosis not present

## 2021-04-04 DIAGNOSIS — H409 Unspecified glaucoma: Secondary | ICD-10-CM | POA: Diagnosis not present

## 2021-04-04 DIAGNOSIS — E039 Hypothyroidism, unspecified: Secondary | ICD-10-CM | POA: Diagnosis not present

## 2021-04-04 DIAGNOSIS — I10 Essential (primary) hypertension: Secondary | ICD-10-CM | POA: Diagnosis not present

## 2021-04-04 DIAGNOSIS — Z85820 Personal history of malignant melanoma of skin: Secondary | ICD-10-CM | POA: Diagnosis not present

## 2021-04-04 DIAGNOSIS — E871 Hypo-osmolality and hyponatremia: Secondary | ICD-10-CM | POA: Diagnosis not present

## 2021-04-04 DIAGNOSIS — S2242XD Multiple fractures of ribs, left side, subsequent encounter for fracture with routine healing: Secondary | ICD-10-CM | POA: Diagnosis not present

## 2021-04-04 DIAGNOSIS — M1712 Unilateral primary osteoarthritis, left knee: Secondary | ICD-10-CM | POA: Diagnosis not present

## 2021-04-04 DIAGNOSIS — Z9181 History of falling: Secondary | ICD-10-CM | POA: Diagnosis not present

## 2021-04-04 DIAGNOSIS — K219 Gastro-esophageal reflux disease without esophagitis: Secondary | ICD-10-CM | POA: Diagnosis not present

## 2021-04-04 DIAGNOSIS — Z853 Personal history of malignant neoplasm of breast: Secondary | ICD-10-CM | POA: Diagnosis not present

## 2021-04-04 DIAGNOSIS — M5136 Other intervertebral disc degeneration, lumbar region: Secondary | ICD-10-CM | POA: Diagnosis not present

## 2021-04-04 DIAGNOSIS — H547 Unspecified visual loss: Secondary | ICD-10-CM | POA: Diagnosis not present

## 2021-04-05 DIAGNOSIS — M1712 Unilateral primary osteoarthritis, left knee: Secondary | ICD-10-CM | POA: Diagnosis not present

## 2021-04-05 DIAGNOSIS — E871 Hypo-osmolality and hyponatremia: Secondary | ICD-10-CM | POA: Diagnosis not present

## 2021-04-05 DIAGNOSIS — Z9012 Acquired absence of left breast and nipple: Secondary | ICD-10-CM | POA: Diagnosis not present

## 2021-04-05 DIAGNOSIS — H409 Unspecified glaucoma: Secondary | ICD-10-CM | POA: Diagnosis not present

## 2021-04-05 DIAGNOSIS — Z7982 Long term (current) use of aspirin: Secondary | ICD-10-CM | POA: Diagnosis not present

## 2021-04-05 DIAGNOSIS — I639 Cerebral infarction, unspecified: Secondary | ICD-10-CM | POA: Diagnosis not present

## 2021-04-05 DIAGNOSIS — I10 Essential (primary) hypertension: Secondary | ICD-10-CM | POA: Diagnosis not present

## 2021-04-05 DIAGNOSIS — Z9181 History of falling: Secondary | ICD-10-CM | POA: Diagnosis not present

## 2021-04-05 DIAGNOSIS — S8002XD Contusion of left knee, subsequent encounter: Secondary | ICD-10-CM | POA: Diagnosis not present

## 2021-04-05 DIAGNOSIS — S2242XD Multiple fractures of ribs, left side, subsequent encounter for fracture with routine healing: Secondary | ICD-10-CM | POA: Diagnosis not present

## 2021-04-05 DIAGNOSIS — Z853 Personal history of malignant neoplasm of breast: Secondary | ICD-10-CM | POA: Diagnosis not present

## 2021-04-05 DIAGNOSIS — I69354 Hemiplegia and hemiparesis following cerebral infarction affecting left non-dominant side: Secondary | ICD-10-CM | POA: Diagnosis not present

## 2021-04-05 DIAGNOSIS — H547 Unspecified visual loss: Secondary | ICD-10-CM | POA: Diagnosis not present

## 2021-04-05 DIAGNOSIS — E039 Hypothyroidism, unspecified: Secondary | ICD-10-CM | POA: Diagnosis not present

## 2021-04-05 DIAGNOSIS — Z85820 Personal history of malignant melanoma of skin: Secondary | ICD-10-CM | POA: Diagnosis not present

## 2021-04-05 DIAGNOSIS — K219 Gastro-esophageal reflux disease without esophagitis: Secondary | ICD-10-CM | POA: Diagnosis not present

## 2021-04-05 DIAGNOSIS — M23322 Other meniscus derangements, posterior horn of medial meniscus, left knee: Secondary | ICD-10-CM | POA: Diagnosis not present

## 2021-04-05 DIAGNOSIS — F32A Depression, unspecified: Secondary | ICD-10-CM | POA: Diagnosis not present

## 2021-04-05 DIAGNOSIS — M5136 Other intervertebral disc degeneration, lumbar region: Secondary | ICD-10-CM | POA: Diagnosis not present

## 2021-04-06 DIAGNOSIS — Z9181 History of falling: Secondary | ICD-10-CM | POA: Diagnosis not present

## 2021-04-06 DIAGNOSIS — S8002XD Contusion of left knee, subsequent encounter: Secondary | ICD-10-CM | POA: Diagnosis not present

## 2021-04-06 DIAGNOSIS — M23322 Other meniscus derangements, posterior horn of medial meniscus, left knee: Secondary | ICD-10-CM | POA: Diagnosis not present

## 2021-04-06 DIAGNOSIS — M5136 Other intervertebral disc degeneration, lumbar region: Secondary | ICD-10-CM | POA: Diagnosis not present

## 2021-04-06 DIAGNOSIS — Z9012 Acquired absence of left breast and nipple: Secondary | ICD-10-CM | POA: Diagnosis not present

## 2021-04-06 DIAGNOSIS — K219 Gastro-esophageal reflux disease without esophagitis: Secondary | ICD-10-CM | POA: Diagnosis not present

## 2021-04-06 DIAGNOSIS — H409 Unspecified glaucoma: Secondary | ICD-10-CM | POA: Diagnosis not present

## 2021-04-06 DIAGNOSIS — M1712 Unilateral primary osteoarthritis, left knee: Secondary | ICD-10-CM | POA: Diagnosis not present

## 2021-04-06 DIAGNOSIS — F32A Depression, unspecified: Secondary | ICD-10-CM | POA: Diagnosis not present

## 2021-04-06 DIAGNOSIS — I10 Essential (primary) hypertension: Secondary | ICD-10-CM | POA: Diagnosis not present

## 2021-04-06 DIAGNOSIS — Z85820 Personal history of malignant melanoma of skin: Secondary | ICD-10-CM | POA: Diagnosis not present

## 2021-04-06 DIAGNOSIS — E039 Hypothyroidism, unspecified: Secondary | ICD-10-CM | POA: Diagnosis not present

## 2021-04-06 DIAGNOSIS — Z853 Personal history of malignant neoplasm of breast: Secondary | ICD-10-CM | POA: Diagnosis not present

## 2021-04-06 DIAGNOSIS — H547 Unspecified visual loss: Secondary | ICD-10-CM | POA: Diagnosis not present

## 2021-04-06 DIAGNOSIS — E871 Hypo-osmolality and hyponatremia: Secondary | ICD-10-CM | POA: Diagnosis not present

## 2021-04-06 DIAGNOSIS — I69354 Hemiplegia and hemiparesis following cerebral infarction affecting left non-dominant side: Secondary | ICD-10-CM | POA: Diagnosis not present

## 2021-04-06 DIAGNOSIS — Z7982 Long term (current) use of aspirin: Secondary | ICD-10-CM | POA: Diagnosis not present

## 2021-04-06 DIAGNOSIS — S2242XD Multiple fractures of ribs, left side, subsequent encounter for fracture with routine healing: Secondary | ICD-10-CM | POA: Diagnosis not present

## 2021-04-07 DIAGNOSIS — H5319 Other subjective visual disturbances: Secondary | ICD-10-CM | POA: Diagnosis not present

## 2021-04-08 DIAGNOSIS — Z853 Personal history of malignant neoplasm of breast: Secondary | ICD-10-CM | POA: Diagnosis not present

## 2021-04-08 DIAGNOSIS — E039 Hypothyroidism, unspecified: Secondary | ICD-10-CM | POA: Diagnosis not present

## 2021-04-08 DIAGNOSIS — M23322 Other meniscus derangements, posterior horn of medial meniscus, left knee: Secondary | ICD-10-CM | POA: Diagnosis not present

## 2021-04-08 DIAGNOSIS — S8002XD Contusion of left knee, subsequent encounter: Secondary | ICD-10-CM | POA: Diagnosis not present

## 2021-04-08 DIAGNOSIS — M1712 Unilateral primary osteoarthritis, left knee: Secondary | ICD-10-CM | POA: Diagnosis not present

## 2021-04-08 DIAGNOSIS — Z7982 Long term (current) use of aspirin: Secondary | ICD-10-CM | POA: Diagnosis not present

## 2021-04-08 DIAGNOSIS — I10 Essential (primary) hypertension: Secondary | ICD-10-CM | POA: Diagnosis not present

## 2021-04-08 DIAGNOSIS — E871 Hypo-osmolality and hyponatremia: Secondary | ICD-10-CM | POA: Diagnosis not present

## 2021-04-08 DIAGNOSIS — S2242XD Multiple fractures of ribs, left side, subsequent encounter for fracture with routine healing: Secondary | ICD-10-CM | POA: Diagnosis not present

## 2021-04-08 DIAGNOSIS — H547 Unspecified visual loss: Secondary | ICD-10-CM | POA: Diagnosis not present

## 2021-04-08 DIAGNOSIS — H409 Unspecified glaucoma: Secondary | ICD-10-CM | POA: Diagnosis not present

## 2021-04-08 DIAGNOSIS — K219 Gastro-esophageal reflux disease without esophagitis: Secondary | ICD-10-CM | POA: Diagnosis not present

## 2021-04-08 DIAGNOSIS — I69354 Hemiplegia and hemiparesis following cerebral infarction affecting left non-dominant side: Secondary | ICD-10-CM | POA: Diagnosis not present

## 2021-04-08 DIAGNOSIS — Z85820 Personal history of malignant melanoma of skin: Secondary | ICD-10-CM | POA: Diagnosis not present

## 2021-04-08 DIAGNOSIS — Z9012 Acquired absence of left breast and nipple: Secondary | ICD-10-CM | POA: Diagnosis not present

## 2021-04-08 DIAGNOSIS — M5136 Other intervertebral disc degeneration, lumbar region: Secondary | ICD-10-CM | POA: Diagnosis not present

## 2021-04-08 DIAGNOSIS — Z9181 History of falling: Secondary | ICD-10-CM | POA: Diagnosis not present

## 2021-04-08 DIAGNOSIS — F32A Depression, unspecified: Secondary | ICD-10-CM | POA: Diagnosis not present

## 2021-04-11 DIAGNOSIS — I69354 Hemiplegia and hemiparesis following cerebral infarction affecting left non-dominant side: Secondary | ICD-10-CM | POA: Diagnosis not present

## 2021-04-11 DIAGNOSIS — Z9181 History of falling: Secondary | ICD-10-CM | POA: Diagnosis not present

## 2021-04-11 DIAGNOSIS — Z85820 Personal history of malignant melanoma of skin: Secondary | ICD-10-CM | POA: Diagnosis not present

## 2021-04-11 DIAGNOSIS — H409 Unspecified glaucoma: Secondary | ICD-10-CM | POA: Diagnosis not present

## 2021-04-11 DIAGNOSIS — M5136 Other intervertebral disc degeneration, lumbar region: Secondary | ICD-10-CM | POA: Diagnosis not present

## 2021-04-11 DIAGNOSIS — F32A Depression, unspecified: Secondary | ICD-10-CM | POA: Diagnosis not present

## 2021-04-11 DIAGNOSIS — M23322 Other meniscus derangements, posterior horn of medial meniscus, left knee: Secondary | ICD-10-CM | POA: Diagnosis not present

## 2021-04-11 DIAGNOSIS — Z7982 Long term (current) use of aspirin: Secondary | ICD-10-CM | POA: Diagnosis not present

## 2021-04-11 DIAGNOSIS — H547 Unspecified visual loss: Secondary | ICD-10-CM | POA: Diagnosis not present

## 2021-04-11 DIAGNOSIS — E871 Hypo-osmolality and hyponatremia: Secondary | ICD-10-CM | POA: Diagnosis not present

## 2021-04-11 DIAGNOSIS — M1712 Unilateral primary osteoarthritis, left knee: Secondary | ICD-10-CM | POA: Diagnosis not present

## 2021-04-11 DIAGNOSIS — I10 Essential (primary) hypertension: Secondary | ICD-10-CM | POA: Diagnosis not present

## 2021-04-11 DIAGNOSIS — K219 Gastro-esophageal reflux disease without esophagitis: Secondary | ICD-10-CM | POA: Diagnosis not present

## 2021-04-11 DIAGNOSIS — Z853 Personal history of malignant neoplasm of breast: Secondary | ICD-10-CM | POA: Diagnosis not present

## 2021-04-11 DIAGNOSIS — S2242XD Multiple fractures of ribs, left side, subsequent encounter for fracture with routine healing: Secondary | ICD-10-CM | POA: Diagnosis not present

## 2021-04-11 DIAGNOSIS — Z9012 Acquired absence of left breast and nipple: Secondary | ICD-10-CM | POA: Diagnosis not present

## 2021-04-11 DIAGNOSIS — E039 Hypothyroidism, unspecified: Secondary | ICD-10-CM | POA: Diagnosis not present

## 2021-04-11 DIAGNOSIS — S8002XD Contusion of left knee, subsequent encounter: Secondary | ICD-10-CM | POA: Diagnosis not present

## 2021-04-13 DIAGNOSIS — M25562 Pain in left knee: Secondary | ICD-10-CM | POA: Diagnosis not present

## 2021-04-14 ENCOUNTER — Other Ambulatory Visit: Payer: Self-pay | Admitting: Hematology and Oncology

## 2021-04-14 DIAGNOSIS — C50411 Malignant neoplasm of upper-outer quadrant of right female breast: Secondary | ICD-10-CM

## 2021-04-19 DIAGNOSIS — M25562 Pain in left knee: Secondary | ICD-10-CM | POA: Diagnosis not present

## 2021-04-21 DIAGNOSIS — M25562 Pain in left knee: Secondary | ICD-10-CM | POA: Diagnosis not present

## 2021-04-26 DIAGNOSIS — M25562 Pain in left knee: Secondary | ICD-10-CM | POA: Diagnosis not present

## 2021-04-28 DIAGNOSIS — M25562 Pain in left knee: Secondary | ICD-10-CM | POA: Diagnosis not present

## 2021-04-29 DIAGNOSIS — I10 Essential (primary) hypertension: Secondary | ICD-10-CM | POA: Diagnosis not present

## 2021-04-29 DIAGNOSIS — R300 Dysuria: Secondary | ICD-10-CM | POA: Diagnosis not present

## 2021-04-29 DIAGNOSIS — E871 Hypo-osmolality and hyponatremia: Secondary | ICD-10-CM | POA: Diagnosis not present

## 2021-04-29 DIAGNOSIS — Z6824 Body mass index (BMI) 24.0-24.9, adult: Secondary | ICD-10-CM | POA: Diagnosis not present

## 2021-04-29 DIAGNOSIS — R6889 Other general symptoms and signs: Secondary | ICD-10-CM | POA: Diagnosis not present

## 2021-04-29 DIAGNOSIS — H6982 Other specified disorders of Eustachian tube, left ear: Secondary | ICD-10-CM | POA: Diagnosis not present

## 2021-04-29 DIAGNOSIS — R531 Weakness: Secondary | ICD-10-CM | POA: Diagnosis not present

## 2021-04-29 DIAGNOSIS — I951 Orthostatic hypotension: Secondary | ICD-10-CM | POA: Diagnosis not present

## 2021-05-03 DIAGNOSIS — M25562 Pain in left knee: Secondary | ICD-10-CM | POA: Diagnosis not present

## 2021-05-03 DIAGNOSIS — M5136 Other intervertebral disc degeneration, lumbar region: Secondary | ICD-10-CM | POA: Diagnosis not present

## 2021-05-04 DIAGNOSIS — I639 Cerebral infarction, unspecified: Secondary | ICD-10-CM | POA: Diagnosis not present

## 2021-05-05 DIAGNOSIS — M25562 Pain in left knee: Secondary | ICD-10-CM | POA: Diagnosis not present

## 2021-05-10 DIAGNOSIS — M25562 Pain in left knee: Secondary | ICD-10-CM | POA: Diagnosis not present

## 2021-05-12 DIAGNOSIS — S9032XA Contusion of left foot, initial encounter: Secondary | ICD-10-CM | POA: Diagnosis not present

## 2021-05-17 DIAGNOSIS — J019 Acute sinusitis, unspecified: Secondary | ICD-10-CM | POA: Diagnosis not present

## 2021-05-17 DIAGNOSIS — H81399 Other peripheral vertigo, unspecified ear: Secondary | ICD-10-CM | POA: Diagnosis not present

## 2021-05-20 DIAGNOSIS — Z01818 Encounter for other preprocedural examination: Secondary | ICD-10-CM | POA: Diagnosis not present

## 2021-05-20 DIAGNOSIS — H33001 Unspecified retinal detachment with retinal break, right eye: Secondary | ICD-10-CM | POA: Diagnosis not present

## 2021-05-23 IMAGING — CT CT ABD-PELV W/ CM
2 of 5 series · 15 of 46 positions shown, 17 images · IV contrast (omnipaque)
Comparison: December 09, 2014.

CLINICAL DATA: Altered bowel habits, generalized abdominal pain in
a 79-year-old female

EXAM:
CT ABDOMEN AND PELVIS WITH CONTRAST
TECHNIQUE: Multidetector CT imaging of the abdomen and pelvis was performed
using the standard protocol following bolus administration of
intravenous contrast.
CONTRAST:  100mL OMNIPAQUE IOHEXOL 300 MG/ML  SOLN

[Series 2: axial st · axial · 0.77mm/px · z∈[-543,-208]mm · 12 of 81 slices shown, 14 images]
[im 7/81  soft-tissue]
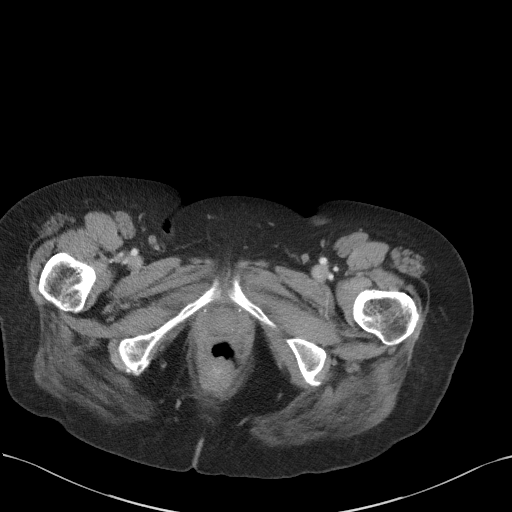
[im 7/81  bone]
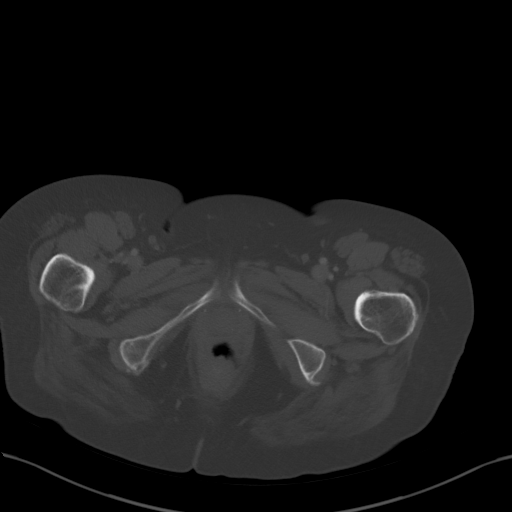
[im 13/81  soft-tissue]
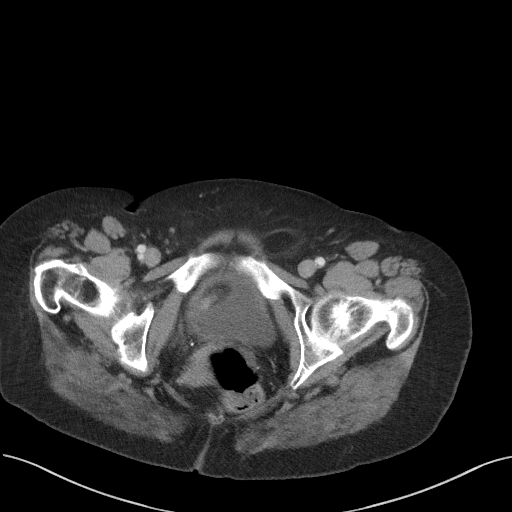
[im 19/81  soft-tissue]
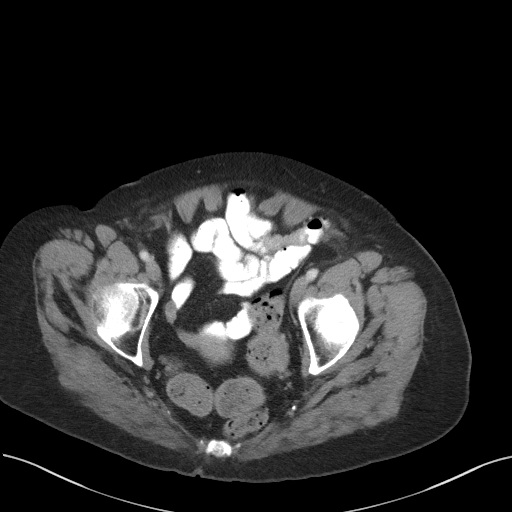
[im 25/81  soft-tissue]
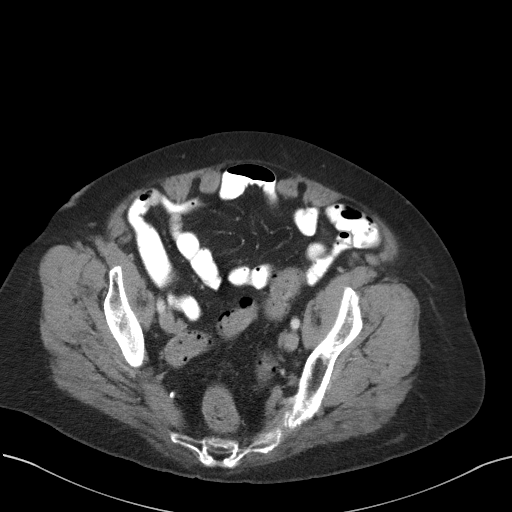
[im 31/81  soft-tissue]
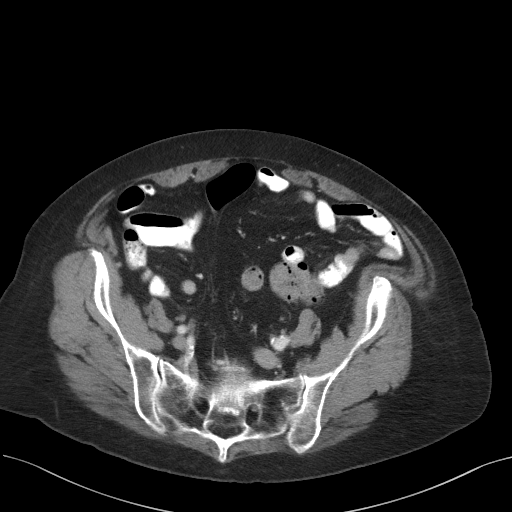
[im 37/81  soft-tissue]
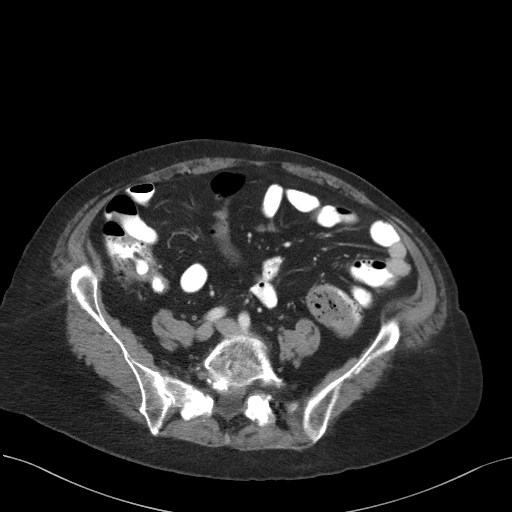
[im 44/81  soft-tissue]
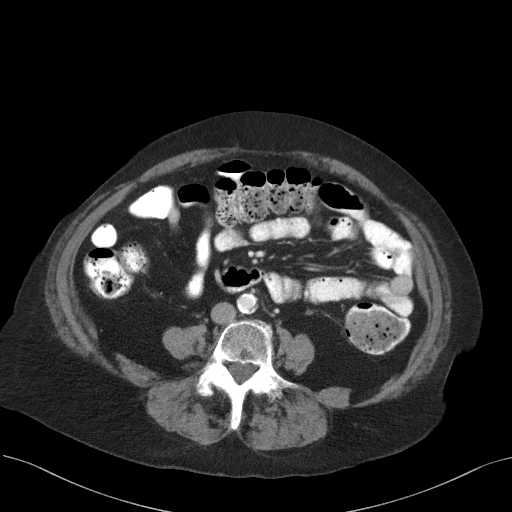
[im 50/81  soft-tissue]
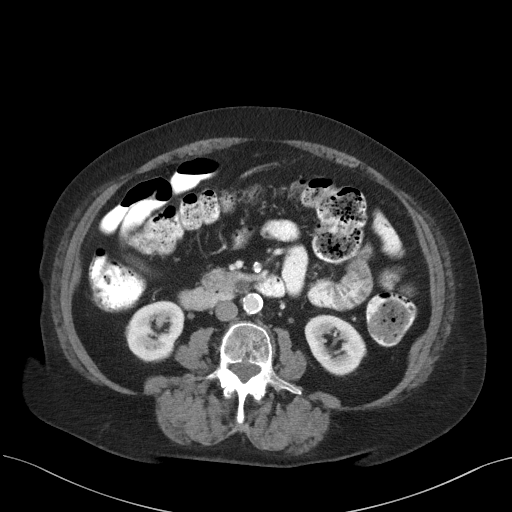
[im 56/81  soft-tissue]
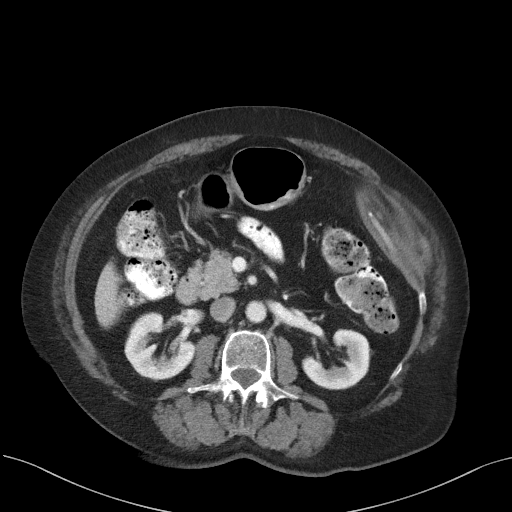
[im 56/81  bone]
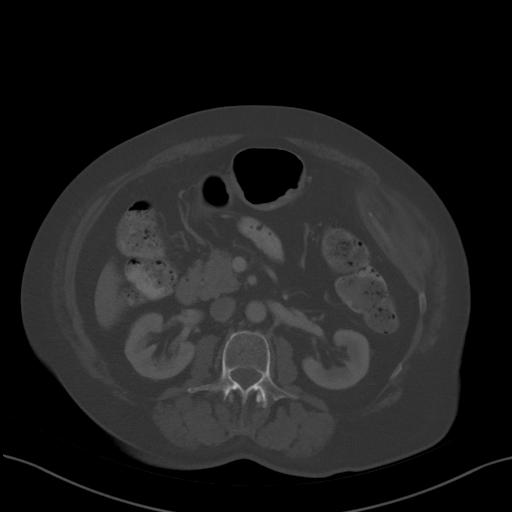
[im 62/81  soft-tissue]
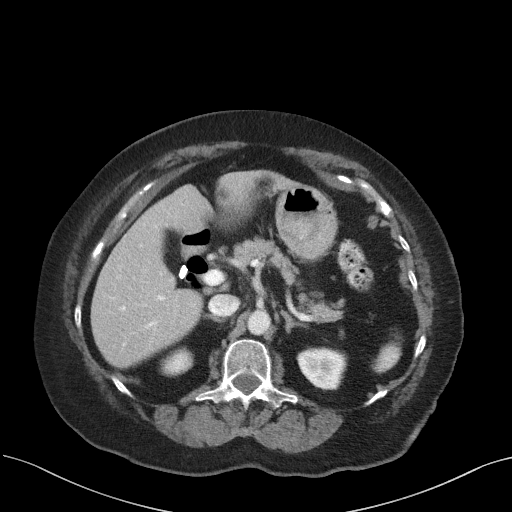
[im 68/81  soft-tissue]
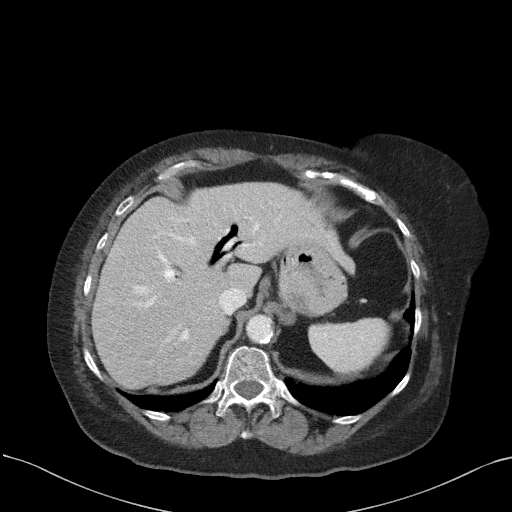
[im 74/81  soft-tissue]
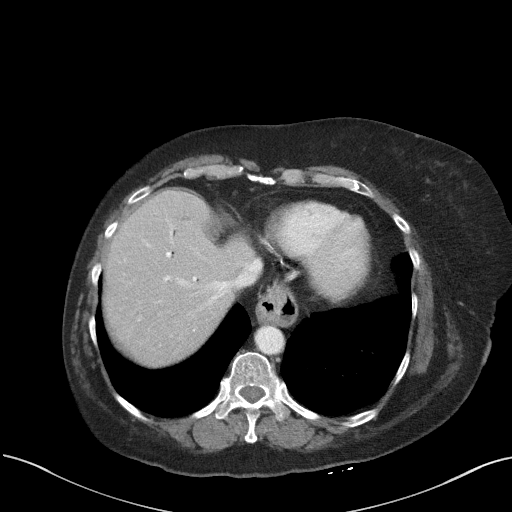

[Series 5: coronal st · coronal · 0.68mm/px · 3 of 91 slices shown]
[im 31/91  soft-tissue]
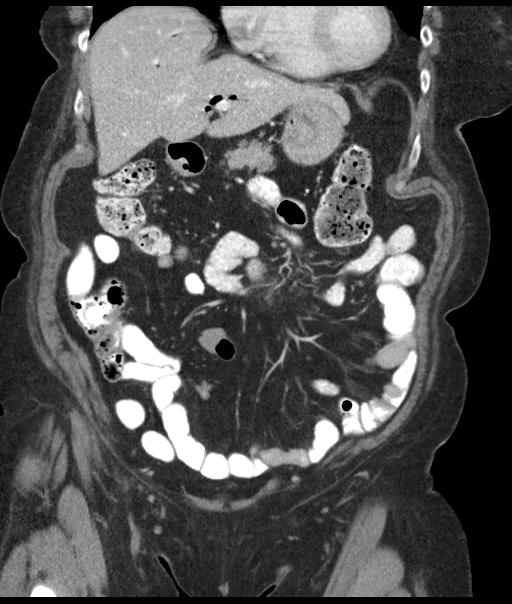
[im 41/91  soft-tissue]
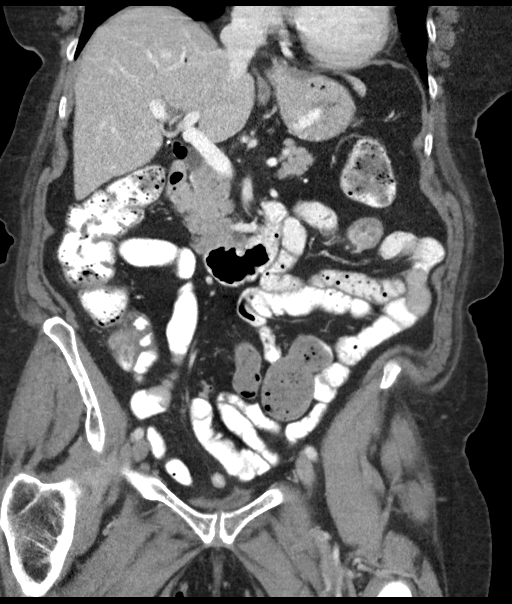
[im 51/91  soft-tissue]
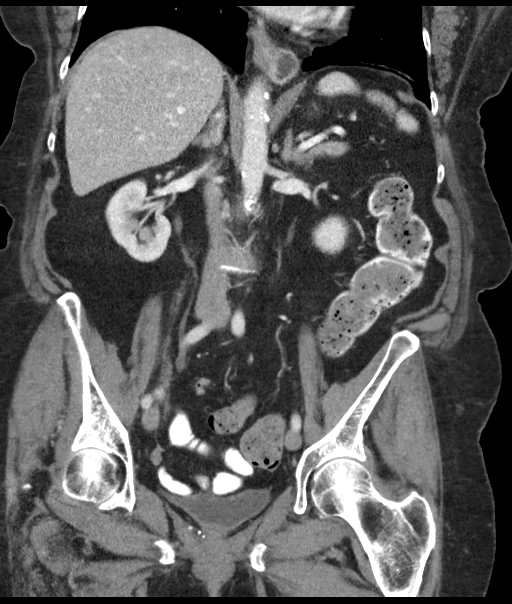

[15 of 46 positions shown; findings below may reference images not displayed]

FINDINGS: Lower chest: Incidental imaging of the lung bases without effusion
or consolidation.

Hepatobiliary: Pneumobilia. Similar appearance of common bile duct
distension following cholecystectomy when compared to prior imaging.
No suspicious focal hepatic lesion. Portal vein is patent.

Pancreas: Normal, without mass, inflammation or ductal dilatation.

Spleen: Spleen no signs and contour without focal lesion.

Adrenals/Urinary Tract: Adrenal glands are normal.

Symmetric renal enhancement. No hydronephrosis. Smooth contour the
urinary bladder.

Stomach/Bowel: Small hiatal hernia. No acute small bowel process.
Ingested contrast material passes into the transverse colon. Stool
fills much of the colon. Redundant distal colon. Appendix not
visualized. No sign of pericecal stranding.

Vascular/Lymphatic: Calcified and noncalcified atheromatous plaque
of the abdominal aorta without sign of aneurysmal dilation. There is
no gastrohepatic or hepatoduodenal ligament lymphadenopathy. No
retroperitoneal or mesenteric lymphadenopathy.

No pelvic sidewall lymphadenopathy.

Reproductive: No adnexal mass. Unremarkable appearance of
reproductive structures by CT.

Other: No ascites.  No free air.

Musculoskeletal: Spinal degenerative changes without acute or
destructive bone process.
IMPRESSION: 1. No acute findings in the abdomen or pelvis.
2. Pneumobilia and dilation of the common bile duct following
cholecystectomy, similar to prior imaging. Correlate with any
symptoms that would suggest cholangitis though findings may be seen
as normal finding following sphincterotomy.
3. Small hiatal hernia.
4. Aortic atherosclerosis.

Aortic Atherosclerosis ([id number]).

## 2021-05-27 DIAGNOSIS — G720 Drug-induced myopathy: Secondary | ICD-10-CM | POA: Diagnosis not present

## 2021-05-27 DIAGNOSIS — Z9181 History of falling: Secondary | ICD-10-CM | POA: Diagnosis not present

## 2021-05-27 DIAGNOSIS — E871 Hypo-osmolality and hyponatremia: Secondary | ICD-10-CM | POA: Diagnosis not present

## 2021-05-27 DIAGNOSIS — I951 Orthostatic hypotension: Secondary | ICD-10-CM | POA: Diagnosis not present

## 2021-05-27 DIAGNOSIS — I69354 Hemiplegia and hemiparesis following cerebral infarction affecting left non-dominant side: Secondary | ICD-10-CM | POA: Diagnosis not present

## 2021-05-27 DIAGNOSIS — I672 Cerebral atherosclerosis: Secondary | ICD-10-CM | POA: Diagnosis not present

## 2021-05-27 DIAGNOSIS — Z6823 Body mass index (BMI) 23.0-23.9, adult: Secondary | ICD-10-CM | POA: Diagnosis not present

## 2021-05-27 DIAGNOSIS — Z8673 Personal history of transient ischemic attack (TIA), and cerebral infarction without residual deficits: Secondary | ICD-10-CM | POA: Diagnosis not present

## 2021-05-27 DIAGNOSIS — I69349 Monoplegia of lower limb following cerebral infarction affecting unspecified side: Secondary | ICD-10-CM | POA: Diagnosis not present

## 2021-05-27 DIAGNOSIS — I10 Essential (primary) hypertension: Secondary | ICD-10-CM | POA: Diagnosis not present

## 2021-05-27 DIAGNOSIS — I700 Atherosclerosis of aorta: Secondary | ICD-10-CM | POA: Diagnosis not present

## 2021-05-30 DIAGNOSIS — H33001 Unspecified retinal detachment with retinal break, right eye: Secondary | ICD-10-CM | POA: Diagnosis not present

## 2021-05-30 DIAGNOSIS — H3322 Serous retinal detachment, left eye: Secondary | ICD-10-CM | POA: Diagnosis not present

## 2021-05-30 DIAGNOSIS — Z48810 Encounter for surgical aftercare following surgery on the sense organs: Secondary | ICD-10-CM | POA: Diagnosis not present

## 2021-06-04 DIAGNOSIS — I639 Cerebral infarction, unspecified: Secondary | ICD-10-CM | POA: Diagnosis not present

## 2021-06-08 DIAGNOSIS — I672 Cerebral atherosclerosis: Secondary | ICD-10-CM | POA: Diagnosis not present

## 2021-06-08 DIAGNOSIS — I951 Orthostatic hypotension: Secondary | ICD-10-CM | POA: Diagnosis not present

## 2021-06-08 DIAGNOSIS — I69354 Hemiplegia and hemiparesis following cerebral infarction affecting left non-dominant side: Secondary | ICD-10-CM | POA: Diagnosis not present

## 2021-06-08 DIAGNOSIS — R42 Dizziness and giddiness: Secondary | ICD-10-CM | POA: Diagnosis not present

## 2021-06-15 DIAGNOSIS — M25562 Pain in left knee: Secondary | ICD-10-CM | POA: Diagnosis not present

## 2021-06-17 DIAGNOSIS — R5381 Other malaise: Secondary | ICD-10-CM | POA: Diagnosis not present

## 2021-06-17 DIAGNOSIS — I639 Cerebral infarction, unspecified: Secondary | ICD-10-CM | POA: Diagnosis not present

## 2021-06-17 DIAGNOSIS — I6501 Occlusion and stenosis of right vertebral artery: Secondary | ICD-10-CM | POA: Diagnosis not present

## 2021-06-17 DIAGNOSIS — E039 Hypothyroidism, unspecified: Secondary | ICD-10-CM | POA: Diagnosis not present

## 2021-06-17 DIAGNOSIS — I10 Essential (primary) hypertension: Secondary | ICD-10-CM | POA: Diagnosis not present

## 2021-06-17 DIAGNOSIS — R42 Dizziness and giddiness: Secondary | ICD-10-CM | POA: Diagnosis not present

## 2021-06-17 DIAGNOSIS — Z7982 Long term (current) use of aspirin: Secondary | ICD-10-CM | POA: Diagnosis not present

## 2021-06-17 DIAGNOSIS — I619 Nontraumatic intracerebral hemorrhage, unspecified: Secondary | ICD-10-CM | POA: Diagnosis not present

## 2021-06-17 DIAGNOSIS — I6621 Occlusion and stenosis of right posterior cerebral artery: Secondary | ICD-10-CM | POA: Diagnosis not present

## 2021-06-17 DIAGNOSIS — I672 Cerebral atherosclerosis: Secondary | ICD-10-CM | POA: Diagnosis not present

## 2021-06-17 DIAGNOSIS — J9811 Atelectasis: Secondary | ICD-10-CM | POA: Diagnosis not present

## 2021-06-17 DIAGNOSIS — Z79899 Other long term (current) drug therapy: Secondary | ICD-10-CM | POA: Diagnosis not present

## 2021-06-17 DIAGNOSIS — R404 Transient alteration of awareness: Secondary | ICD-10-CM | POA: Diagnosis not present

## 2021-06-17 DIAGNOSIS — I447 Left bundle-branch block, unspecified: Secondary | ICD-10-CM | POA: Diagnosis not present

## 2021-06-17 DIAGNOSIS — R6889 Other general symptoms and signs: Secondary | ICD-10-CM | POA: Diagnosis not present

## 2021-06-17 DIAGNOSIS — I6389 Other cerebral infarction: Secondary | ICD-10-CM | POA: Diagnosis not present

## 2021-06-17 DIAGNOSIS — R531 Weakness: Secondary | ICD-10-CM | POA: Diagnosis not present

## 2021-06-17 DIAGNOSIS — Z743 Need for continuous supervision: Secondary | ICD-10-CM | POA: Diagnosis not present

## 2021-06-17 DIAGNOSIS — R1310 Dysphagia, unspecified: Secondary | ICD-10-CM | POA: Diagnosis not present

## 2021-06-17 DIAGNOSIS — Z853 Personal history of malignant neoplasm of breast: Secondary | ICD-10-CM | POA: Diagnosis not present

## 2021-06-17 DIAGNOSIS — I69354 Hemiplegia and hemiparesis following cerebral infarction affecting left non-dominant side: Secondary | ICD-10-CM | POA: Diagnosis not present

## 2021-06-17 DIAGNOSIS — Z7901 Long term (current) use of anticoagulants: Secondary | ICD-10-CM | POA: Diagnosis not present

## 2021-06-18 DIAGNOSIS — R29702 NIHSS score 2: Secondary | ICD-10-CM | POA: Diagnosis not present

## 2021-06-18 DIAGNOSIS — E039 Hypothyroidism, unspecified: Secondary | ICD-10-CM | POA: Diagnosis not present

## 2021-06-18 DIAGNOSIS — Z79899 Other long term (current) drug therapy: Secondary | ICD-10-CM | POA: Diagnosis not present

## 2021-06-18 DIAGNOSIS — R42 Dizziness and giddiness: Secondary | ICD-10-CM | POA: Diagnosis not present

## 2021-06-18 DIAGNOSIS — I69354 Hemiplegia and hemiparesis following cerebral infarction affecting left non-dominant side: Secondary | ICD-10-CM | POA: Diagnosis not present

## 2021-06-18 DIAGNOSIS — Z7902 Long term (current) use of antithrombotics/antiplatelets: Secondary | ICD-10-CM | POA: Diagnosis not present

## 2021-06-18 DIAGNOSIS — I10 Essential (primary) hypertension: Secondary | ICD-10-CM | POA: Diagnosis not present

## 2021-06-18 DIAGNOSIS — Z7982 Long term (current) use of aspirin: Secondary | ICD-10-CM | POA: Diagnosis not present

## 2021-06-18 DIAGNOSIS — I639 Cerebral infarction, unspecified: Secondary | ICD-10-CM | POA: Diagnosis not present

## 2021-06-18 DIAGNOSIS — Z853 Personal history of malignant neoplasm of breast: Secondary | ICD-10-CM | POA: Diagnosis not present

## 2021-06-19 DIAGNOSIS — Z853 Personal history of malignant neoplasm of breast: Secondary | ICD-10-CM | POA: Diagnosis not present

## 2021-06-19 DIAGNOSIS — I10 Essential (primary) hypertension: Secondary | ICD-10-CM | POA: Diagnosis not present

## 2021-06-19 DIAGNOSIS — Z7982 Long term (current) use of aspirin: Secondary | ICD-10-CM | POA: Diagnosis not present

## 2021-06-19 DIAGNOSIS — I69354 Hemiplegia and hemiparesis following cerebral infarction affecting left non-dominant side: Secondary | ICD-10-CM | POA: Diagnosis not present

## 2021-06-19 DIAGNOSIS — R42 Dizziness and giddiness: Secondary | ICD-10-CM | POA: Diagnosis not present

## 2021-06-19 DIAGNOSIS — Z79899 Other long term (current) drug therapy: Secondary | ICD-10-CM | POA: Diagnosis not present

## 2021-06-19 DIAGNOSIS — I639 Cerebral infarction, unspecified: Secondary | ICD-10-CM | POA: Diagnosis not present

## 2021-06-19 DIAGNOSIS — E039 Hypothyroidism, unspecified: Secondary | ICD-10-CM | POA: Diagnosis not present

## 2021-06-21 ENCOUNTER — Other Ambulatory Visit: Payer: Self-pay

## 2021-06-21 DIAGNOSIS — Z7952 Long term (current) use of systemic steroids: Secondary | ICD-10-CM | POA: Diagnosis not present

## 2021-06-21 DIAGNOSIS — S8002XD Contusion of left knee, subsequent encounter: Secondary | ICD-10-CM | POA: Diagnosis not present

## 2021-06-21 DIAGNOSIS — M23322 Other meniscus derangements, posterior horn of medial meniscus, left knee: Secondary | ICD-10-CM | POA: Diagnosis not present

## 2021-06-21 DIAGNOSIS — M1712 Unilateral primary osteoarthritis, left knee: Secondary | ICD-10-CM | POA: Diagnosis not present

## 2021-06-21 DIAGNOSIS — Z9012 Acquired absence of left breast and nipple: Secondary | ICD-10-CM | POA: Diagnosis not present

## 2021-06-21 DIAGNOSIS — Z85820 Personal history of malignant melanoma of skin: Secondary | ICD-10-CM | POA: Diagnosis not present

## 2021-06-21 DIAGNOSIS — H409 Unspecified glaucoma: Secondary | ICD-10-CM | POA: Diagnosis not present

## 2021-06-21 DIAGNOSIS — Z9181 History of falling: Secondary | ICD-10-CM | POA: Diagnosis not present

## 2021-06-21 DIAGNOSIS — H547 Unspecified visual loss: Secondary | ICD-10-CM | POA: Diagnosis not present

## 2021-06-21 DIAGNOSIS — K219 Gastro-esophageal reflux disease without esophagitis: Secondary | ICD-10-CM | POA: Diagnosis not present

## 2021-06-21 DIAGNOSIS — F32A Depression, unspecified: Secondary | ICD-10-CM | POA: Diagnosis not present

## 2021-06-21 DIAGNOSIS — E039 Hypothyroidism, unspecified: Secondary | ICD-10-CM | POA: Diagnosis not present

## 2021-06-21 DIAGNOSIS — I10 Essential (primary) hypertension: Secondary | ICD-10-CM | POA: Diagnosis not present

## 2021-06-21 DIAGNOSIS — I69354 Hemiplegia and hemiparesis following cerebral infarction affecting left non-dominant side: Secondary | ICD-10-CM | POA: Diagnosis not present

## 2021-06-21 DIAGNOSIS — I619 Nontraumatic intracerebral hemorrhage, unspecified: Secondary | ICD-10-CM | POA: Diagnosis not present

## 2021-06-21 DIAGNOSIS — S2242XD Multiple fractures of ribs, left side, subsequent encounter for fracture with routine healing: Secondary | ICD-10-CM | POA: Diagnosis not present

## 2021-06-21 DIAGNOSIS — Z791 Long term (current) use of non-steroidal anti-inflammatories (NSAID): Secondary | ICD-10-CM | POA: Diagnosis not present

## 2021-06-21 DIAGNOSIS — Z7982 Long term (current) use of aspirin: Secondary | ICD-10-CM | POA: Diagnosis not present

## 2021-06-21 DIAGNOSIS — E871 Hypo-osmolality and hyponatremia: Secondary | ICD-10-CM | POA: Diagnosis not present

## 2021-06-21 DIAGNOSIS — M5136 Other intervertebral disc degeneration, lumbar region: Secondary | ICD-10-CM | POA: Diagnosis not present

## 2021-06-21 DIAGNOSIS — Z7902 Long term (current) use of antithrombotics/antiplatelets: Secondary | ICD-10-CM | POA: Diagnosis not present

## 2021-06-21 DIAGNOSIS — Z853 Personal history of malignant neoplasm of breast: Secondary | ICD-10-CM | POA: Diagnosis not present

## 2021-06-22 ENCOUNTER — Other Ambulatory Visit: Payer: Medicare Other

## 2021-06-22 ENCOUNTER — Ambulatory Visit: Payer: Medicare Other | Admitting: Hematology and Oncology

## 2021-06-24 DIAGNOSIS — M5136 Other intervertebral disc degeneration, lumbar region: Secondary | ICD-10-CM | POA: Diagnosis not present

## 2021-06-24 DIAGNOSIS — Z791 Long term (current) use of non-steroidal anti-inflammatories (NSAID): Secondary | ICD-10-CM | POA: Diagnosis not present

## 2021-06-24 DIAGNOSIS — Z853 Personal history of malignant neoplasm of breast: Secondary | ICD-10-CM | POA: Diagnosis not present

## 2021-06-24 DIAGNOSIS — E871 Hypo-osmolality and hyponatremia: Secondary | ICD-10-CM | POA: Diagnosis not present

## 2021-06-24 DIAGNOSIS — F32A Depression, unspecified: Secondary | ICD-10-CM | POA: Diagnosis not present

## 2021-06-24 DIAGNOSIS — I10 Essential (primary) hypertension: Secondary | ICD-10-CM | POA: Diagnosis not present

## 2021-06-24 DIAGNOSIS — K219 Gastro-esophageal reflux disease without esophagitis: Secondary | ICD-10-CM | POA: Diagnosis not present

## 2021-06-24 DIAGNOSIS — M1712 Unilateral primary osteoarthritis, left knee: Secondary | ICD-10-CM | POA: Diagnosis not present

## 2021-06-24 DIAGNOSIS — I69354 Hemiplegia and hemiparesis following cerebral infarction affecting left non-dominant side: Secondary | ICD-10-CM | POA: Diagnosis not present

## 2021-06-24 DIAGNOSIS — S2242XD Multiple fractures of ribs, left side, subsequent encounter for fracture with routine healing: Secondary | ICD-10-CM | POA: Diagnosis not present

## 2021-06-24 DIAGNOSIS — Z85820 Personal history of malignant melanoma of skin: Secondary | ICD-10-CM | POA: Diagnosis not present

## 2021-06-24 DIAGNOSIS — Z7902 Long term (current) use of antithrombotics/antiplatelets: Secondary | ICD-10-CM | POA: Diagnosis not present

## 2021-06-24 DIAGNOSIS — S8002XD Contusion of left knee, subsequent encounter: Secondary | ICD-10-CM | POA: Diagnosis not present

## 2021-06-24 DIAGNOSIS — H409 Unspecified glaucoma: Secondary | ICD-10-CM | POA: Diagnosis not present

## 2021-06-24 DIAGNOSIS — E039 Hypothyroidism, unspecified: Secondary | ICD-10-CM | POA: Diagnosis not present

## 2021-06-24 DIAGNOSIS — Z9181 History of falling: Secondary | ICD-10-CM | POA: Diagnosis not present

## 2021-06-24 DIAGNOSIS — Z7952 Long term (current) use of systemic steroids: Secondary | ICD-10-CM | POA: Diagnosis not present

## 2021-06-24 DIAGNOSIS — Z7982 Long term (current) use of aspirin: Secondary | ICD-10-CM | POA: Diagnosis not present

## 2021-06-24 DIAGNOSIS — H547 Unspecified visual loss: Secondary | ICD-10-CM | POA: Diagnosis not present

## 2021-06-24 DIAGNOSIS — Z9012 Acquired absence of left breast and nipple: Secondary | ICD-10-CM | POA: Diagnosis not present

## 2021-06-24 DIAGNOSIS — M23322 Other meniscus derangements, posterior horn of medial meniscus, left knee: Secondary | ICD-10-CM | POA: Diagnosis not present

## 2021-06-24 DIAGNOSIS — I619 Nontraumatic intracerebral hemorrhage, unspecified: Secondary | ICD-10-CM | POA: Diagnosis not present

## 2021-06-27 DIAGNOSIS — H409 Unspecified glaucoma: Secondary | ICD-10-CM | POA: Diagnosis not present

## 2021-06-27 DIAGNOSIS — I69349 Monoplegia of lower limb following cerebral infarction affecting unspecified side: Secondary | ICD-10-CM | POA: Diagnosis not present

## 2021-06-27 DIAGNOSIS — S8002XD Contusion of left knee, subsequent encounter: Secondary | ICD-10-CM | POA: Diagnosis not present

## 2021-06-27 DIAGNOSIS — Z8673 Personal history of transient ischemic attack (TIA), and cerebral infarction without residual deficits: Secondary | ICD-10-CM | POA: Diagnosis not present

## 2021-06-27 DIAGNOSIS — Z9181 History of falling: Secondary | ICD-10-CM | POA: Diagnosis not present

## 2021-06-27 DIAGNOSIS — E039 Hypothyroidism, unspecified: Secondary | ICD-10-CM | POA: Diagnosis not present

## 2021-06-27 DIAGNOSIS — I619 Nontraumatic intracerebral hemorrhage, unspecified: Secondary | ICD-10-CM | POA: Diagnosis not present

## 2021-06-27 DIAGNOSIS — Z7952 Long term (current) use of systemic steroids: Secondary | ICD-10-CM | POA: Diagnosis not present

## 2021-06-27 DIAGNOSIS — Z853 Personal history of malignant neoplasm of breast: Secondary | ICD-10-CM | POA: Diagnosis not present

## 2021-06-27 DIAGNOSIS — M1712 Unilateral primary osteoarthritis, left knee: Secondary | ICD-10-CM | POA: Diagnosis not present

## 2021-06-27 DIAGNOSIS — I69354 Hemiplegia and hemiparesis following cerebral infarction affecting left non-dominant side: Secondary | ICD-10-CM | POA: Diagnosis not present

## 2021-06-27 DIAGNOSIS — G720 Drug-induced myopathy: Secondary | ICD-10-CM | POA: Diagnosis not present

## 2021-06-27 DIAGNOSIS — Z85820 Personal history of malignant melanoma of skin: Secondary | ICD-10-CM | POA: Diagnosis not present

## 2021-06-27 DIAGNOSIS — E871 Hypo-osmolality and hyponatremia: Secondary | ICD-10-CM | POA: Diagnosis not present

## 2021-06-27 DIAGNOSIS — Z9012 Acquired absence of left breast and nipple: Secondary | ICD-10-CM | POA: Diagnosis not present

## 2021-06-27 DIAGNOSIS — Z7982 Long term (current) use of aspirin: Secondary | ICD-10-CM | POA: Diagnosis not present

## 2021-06-27 DIAGNOSIS — F32A Depression, unspecified: Secondary | ICD-10-CM | POA: Diagnosis not present

## 2021-06-27 DIAGNOSIS — Z6823 Body mass index (BMI) 23.0-23.9, adult: Secondary | ICD-10-CM | POA: Diagnosis not present

## 2021-06-27 DIAGNOSIS — R42 Dizziness and giddiness: Secondary | ICD-10-CM | POA: Diagnosis not present

## 2021-06-27 DIAGNOSIS — M5136 Other intervertebral disc degeneration, lumbar region: Secondary | ICD-10-CM | POA: Diagnosis not present

## 2021-06-27 DIAGNOSIS — I10 Essential (primary) hypertension: Secondary | ICD-10-CM | POA: Diagnosis not present

## 2021-06-27 DIAGNOSIS — M21372 Foot drop, left foot: Secondary | ICD-10-CM | POA: Diagnosis not present

## 2021-06-27 DIAGNOSIS — H547 Unspecified visual loss: Secondary | ICD-10-CM | POA: Diagnosis not present

## 2021-06-27 DIAGNOSIS — I672 Cerebral atherosclerosis: Secondary | ICD-10-CM | POA: Diagnosis not present

## 2021-06-27 DIAGNOSIS — E782 Mixed hyperlipidemia: Secondary | ICD-10-CM | POA: Diagnosis not present

## 2021-06-27 DIAGNOSIS — S2242XD Multiple fractures of ribs, left side, subsequent encounter for fracture with routine healing: Secondary | ICD-10-CM | POA: Diagnosis not present

## 2021-06-27 DIAGNOSIS — M23322 Other meniscus derangements, posterior horn of medial meniscus, left knee: Secondary | ICD-10-CM | POA: Diagnosis not present

## 2021-06-27 DIAGNOSIS — I951 Orthostatic hypotension: Secondary | ICD-10-CM | POA: Diagnosis not present

## 2021-06-27 DIAGNOSIS — Z791 Long term (current) use of non-steroidal anti-inflammatories (NSAID): Secondary | ICD-10-CM | POA: Diagnosis not present

## 2021-06-27 DIAGNOSIS — Z7902 Long term (current) use of antithrombotics/antiplatelets: Secondary | ICD-10-CM | POA: Diagnosis not present

## 2021-06-27 DIAGNOSIS — K219 Gastro-esophageal reflux disease without esophagitis: Secondary | ICD-10-CM | POA: Diagnosis not present

## 2021-06-30 DIAGNOSIS — E039 Hypothyroidism, unspecified: Secondary | ICD-10-CM | POA: Diagnosis not present

## 2021-06-30 DIAGNOSIS — H409 Unspecified glaucoma: Secondary | ICD-10-CM | POA: Diagnosis not present

## 2021-06-30 DIAGNOSIS — K219 Gastro-esophageal reflux disease without esophagitis: Secondary | ICD-10-CM | POA: Diagnosis not present

## 2021-06-30 DIAGNOSIS — F32A Depression, unspecified: Secondary | ICD-10-CM | POA: Diagnosis not present

## 2021-06-30 DIAGNOSIS — M23322 Other meniscus derangements, posterior horn of medial meniscus, left knee: Secondary | ICD-10-CM | POA: Diagnosis not present

## 2021-06-30 DIAGNOSIS — I69354 Hemiplegia and hemiparesis following cerebral infarction affecting left non-dominant side: Secondary | ICD-10-CM | POA: Diagnosis not present

## 2021-06-30 DIAGNOSIS — E871 Hypo-osmolality and hyponatremia: Secondary | ICD-10-CM | POA: Diagnosis not present

## 2021-06-30 DIAGNOSIS — S8002XD Contusion of left knee, subsequent encounter: Secondary | ICD-10-CM | POA: Diagnosis not present

## 2021-06-30 DIAGNOSIS — I10 Essential (primary) hypertension: Secondary | ICD-10-CM | POA: Diagnosis not present

## 2021-06-30 DIAGNOSIS — Z7952 Long term (current) use of systemic steroids: Secondary | ICD-10-CM | POA: Diagnosis not present

## 2021-06-30 DIAGNOSIS — Z7982 Long term (current) use of aspirin: Secondary | ICD-10-CM | POA: Diagnosis not present

## 2021-06-30 DIAGNOSIS — Z85820 Personal history of malignant melanoma of skin: Secondary | ICD-10-CM | POA: Diagnosis not present

## 2021-06-30 DIAGNOSIS — Z9181 History of falling: Secondary | ICD-10-CM | POA: Diagnosis not present

## 2021-06-30 DIAGNOSIS — Z853 Personal history of malignant neoplasm of breast: Secondary | ICD-10-CM | POA: Diagnosis not present

## 2021-06-30 DIAGNOSIS — I619 Nontraumatic intracerebral hemorrhage, unspecified: Secondary | ICD-10-CM | POA: Diagnosis not present

## 2021-06-30 DIAGNOSIS — S2242XD Multiple fractures of ribs, left side, subsequent encounter for fracture with routine healing: Secondary | ICD-10-CM | POA: Diagnosis not present

## 2021-06-30 DIAGNOSIS — M5136 Other intervertebral disc degeneration, lumbar region: Secondary | ICD-10-CM | POA: Diagnosis not present

## 2021-06-30 DIAGNOSIS — Z7902 Long term (current) use of antithrombotics/antiplatelets: Secondary | ICD-10-CM | POA: Diagnosis not present

## 2021-06-30 DIAGNOSIS — Z9012 Acquired absence of left breast and nipple: Secondary | ICD-10-CM | POA: Diagnosis not present

## 2021-06-30 DIAGNOSIS — H547 Unspecified visual loss: Secondary | ICD-10-CM | POA: Diagnosis not present

## 2021-06-30 DIAGNOSIS — Z791 Long term (current) use of non-steroidal anti-inflammatories (NSAID): Secondary | ICD-10-CM | POA: Diagnosis not present

## 2021-06-30 DIAGNOSIS — M1712 Unilateral primary osteoarthritis, left knee: Secondary | ICD-10-CM | POA: Diagnosis not present

## 2021-07-04 DIAGNOSIS — I639 Cerebral infarction, unspecified: Secondary | ICD-10-CM | POA: Diagnosis not present

## 2021-07-06 DIAGNOSIS — E871 Hypo-osmolality and hyponatremia: Secondary | ICD-10-CM | POA: Diagnosis not present

## 2021-07-06 DIAGNOSIS — I69354 Hemiplegia and hemiparesis following cerebral infarction affecting left non-dominant side: Secondary | ICD-10-CM | POA: Diagnosis not present

## 2021-07-06 DIAGNOSIS — H547 Unspecified visual loss: Secondary | ICD-10-CM | POA: Diagnosis not present

## 2021-07-06 DIAGNOSIS — E039 Hypothyroidism, unspecified: Secondary | ICD-10-CM | POA: Diagnosis not present

## 2021-07-06 DIAGNOSIS — M1712 Unilateral primary osteoarthritis, left knee: Secondary | ICD-10-CM | POA: Diagnosis not present

## 2021-07-06 DIAGNOSIS — Z7952 Long term (current) use of systemic steroids: Secondary | ICD-10-CM | POA: Diagnosis not present

## 2021-07-06 DIAGNOSIS — M5136 Other intervertebral disc degeneration, lumbar region: Secondary | ICD-10-CM | POA: Diagnosis not present

## 2021-07-06 DIAGNOSIS — F32A Depression, unspecified: Secondary | ICD-10-CM | POA: Diagnosis not present

## 2021-07-06 DIAGNOSIS — Z9012 Acquired absence of left breast and nipple: Secondary | ICD-10-CM | POA: Diagnosis not present

## 2021-07-06 DIAGNOSIS — Z7902 Long term (current) use of antithrombotics/antiplatelets: Secondary | ICD-10-CM | POA: Diagnosis not present

## 2021-07-06 DIAGNOSIS — S2242XD Multiple fractures of ribs, left side, subsequent encounter for fracture with routine healing: Secondary | ICD-10-CM | POA: Diagnosis not present

## 2021-07-06 DIAGNOSIS — H409 Unspecified glaucoma: Secondary | ICD-10-CM | POA: Diagnosis not present

## 2021-07-06 DIAGNOSIS — Z853 Personal history of malignant neoplasm of breast: Secondary | ICD-10-CM | POA: Diagnosis not present

## 2021-07-06 DIAGNOSIS — Z791 Long term (current) use of non-steroidal anti-inflammatories (NSAID): Secondary | ICD-10-CM | POA: Diagnosis not present

## 2021-07-06 DIAGNOSIS — M23322 Other meniscus derangements, posterior horn of medial meniscus, left knee: Secondary | ICD-10-CM | POA: Diagnosis not present

## 2021-07-06 DIAGNOSIS — Z7982 Long term (current) use of aspirin: Secondary | ICD-10-CM | POA: Diagnosis not present

## 2021-07-06 DIAGNOSIS — Z9181 History of falling: Secondary | ICD-10-CM | POA: Diagnosis not present

## 2021-07-06 DIAGNOSIS — I10 Essential (primary) hypertension: Secondary | ICD-10-CM | POA: Diagnosis not present

## 2021-07-06 DIAGNOSIS — S8002XD Contusion of left knee, subsequent encounter: Secondary | ICD-10-CM | POA: Diagnosis not present

## 2021-07-06 DIAGNOSIS — K219 Gastro-esophageal reflux disease without esophagitis: Secondary | ICD-10-CM | POA: Diagnosis not present

## 2021-07-06 DIAGNOSIS — I619 Nontraumatic intracerebral hemorrhage, unspecified: Secondary | ICD-10-CM | POA: Diagnosis not present

## 2021-07-06 DIAGNOSIS — Z85820 Personal history of malignant melanoma of skin: Secondary | ICD-10-CM | POA: Diagnosis not present

## 2021-07-08 DIAGNOSIS — Z9012 Acquired absence of left breast and nipple: Secondary | ICD-10-CM | POA: Diagnosis not present

## 2021-07-08 DIAGNOSIS — F32A Depression, unspecified: Secondary | ICD-10-CM | POA: Diagnosis not present

## 2021-07-08 DIAGNOSIS — I69354 Hemiplegia and hemiparesis following cerebral infarction affecting left non-dominant side: Secondary | ICD-10-CM | POA: Diagnosis not present

## 2021-07-08 DIAGNOSIS — K219 Gastro-esophageal reflux disease without esophagitis: Secondary | ICD-10-CM | POA: Diagnosis not present

## 2021-07-08 DIAGNOSIS — I619 Nontraumatic intracerebral hemorrhage, unspecified: Secondary | ICD-10-CM | POA: Diagnosis not present

## 2021-07-08 DIAGNOSIS — H547 Unspecified visual loss: Secondary | ICD-10-CM | POA: Diagnosis not present

## 2021-07-08 DIAGNOSIS — Z85820 Personal history of malignant melanoma of skin: Secondary | ICD-10-CM | POA: Diagnosis not present

## 2021-07-08 DIAGNOSIS — S8002XD Contusion of left knee, subsequent encounter: Secondary | ICD-10-CM | POA: Diagnosis not present

## 2021-07-08 DIAGNOSIS — M23322 Other meniscus derangements, posterior horn of medial meniscus, left knee: Secondary | ICD-10-CM | POA: Diagnosis not present

## 2021-07-08 DIAGNOSIS — H409 Unspecified glaucoma: Secondary | ICD-10-CM | POA: Diagnosis not present

## 2021-07-08 DIAGNOSIS — M1712 Unilateral primary osteoarthritis, left knee: Secondary | ICD-10-CM | POA: Diagnosis not present

## 2021-07-08 DIAGNOSIS — M5136 Other intervertebral disc degeneration, lumbar region: Secondary | ICD-10-CM | POA: Diagnosis not present

## 2021-07-08 DIAGNOSIS — Z791 Long term (current) use of non-steroidal anti-inflammatories (NSAID): Secondary | ICD-10-CM | POA: Diagnosis not present

## 2021-07-08 DIAGNOSIS — Z853 Personal history of malignant neoplasm of breast: Secondary | ICD-10-CM | POA: Diagnosis not present

## 2021-07-08 DIAGNOSIS — Z7952 Long term (current) use of systemic steroids: Secondary | ICD-10-CM | POA: Diagnosis not present

## 2021-07-08 DIAGNOSIS — E871 Hypo-osmolality and hyponatremia: Secondary | ICD-10-CM | POA: Diagnosis not present

## 2021-07-08 DIAGNOSIS — S2242XD Multiple fractures of ribs, left side, subsequent encounter for fracture with routine healing: Secondary | ICD-10-CM | POA: Diagnosis not present

## 2021-07-08 DIAGNOSIS — Z9181 History of falling: Secondary | ICD-10-CM | POA: Diagnosis not present

## 2021-07-08 DIAGNOSIS — Z7982 Long term (current) use of aspirin: Secondary | ICD-10-CM | POA: Diagnosis not present

## 2021-07-08 DIAGNOSIS — Z7902 Long term (current) use of antithrombotics/antiplatelets: Secondary | ICD-10-CM | POA: Diagnosis not present

## 2021-07-08 DIAGNOSIS — E039 Hypothyroidism, unspecified: Secondary | ICD-10-CM | POA: Diagnosis not present

## 2021-07-08 DIAGNOSIS — I10 Essential (primary) hypertension: Secondary | ICD-10-CM | POA: Diagnosis not present

## 2021-07-11 DIAGNOSIS — H33011 Retinal detachment with single break, right eye: Secondary | ICD-10-CM | POA: Diagnosis not present

## 2021-07-12 DIAGNOSIS — K219 Gastro-esophageal reflux disease without esophagitis: Secondary | ICD-10-CM | POA: Diagnosis not present

## 2021-07-12 DIAGNOSIS — M1712 Unilateral primary osteoarthritis, left knee: Secondary | ICD-10-CM | POA: Diagnosis not present

## 2021-07-12 DIAGNOSIS — Z9012 Acquired absence of left breast and nipple: Secondary | ICD-10-CM | POA: Diagnosis not present

## 2021-07-12 DIAGNOSIS — Z791 Long term (current) use of non-steroidal anti-inflammatories (NSAID): Secondary | ICD-10-CM | POA: Diagnosis not present

## 2021-07-12 DIAGNOSIS — Z85820 Personal history of malignant melanoma of skin: Secondary | ICD-10-CM | POA: Diagnosis not present

## 2021-07-12 DIAGNOSIS — I10 Essential (primary) hypertension: Secondary | ICD-10-CM | POA: Diagnosis not present

## 2021-07-12 DIAGNOSIS — Z7982 Long term (current) use of aspirin: Secondary | ICD-10-CM | POA: Diagnosis not present

## 2021-07-12 DIAGNOSIS — S8002XD Contusion of left knee, subsequent encounter: Secondary | ICD-10-CM | POA: Diagnosis not present

## 2021-07-12 DIAGNOSIS — S2242XD Multiple fractures of ribs, left side, subsequent encounter for fracture with routine healing: Secondary | ICD-10-CM | POA: Diagnosis not present

## 2021-07-12 DIAGNOSIS — E039 Hypothyroidism, unspecified: Secondary | ICD-10-CM | POA: Diagnosis not present

## 2021-07-12 DIAGNOSIS — E871 Hypo-osmolality and hyponatremia: Secondary | ICD-10-CM | POA: Diagnosis not present

## 2021-07-12 DIAGNOSIS — M23322 Other meniscus derangements, posterior horn of medial meniscus, left knee: Secondary | ICD-10-CM | POA: Diagnosis not present

## 2021-07-12 DIAGNOSIS — M5136 Other intervertebral disc degeneration, lumbar region: Secondary | ICD-10-CM | POA: Diagnosis not present

## 2021-07-12 DIAGNOSIS — H409 Unspecified glaucoma: Secondary | ICD-10-CM | POA: Diagnosis not present

## 2021-07-12 DIAGNOSIS — F32A Depression, unspecified: Secondary | ICD-10-CM | POA: Diagnosis not present

## 2021-07-12 DIAGNOSIS — Z7902 Long term (current) use of antithrombotics/antiplatelets: Secondary | ICD-10-CM | POA: Diagnosis not present

## 2021-07-12 DIAGNOSIS — I69354 Hemiplegia and hemiparesis following cerebral infarction affecting left non-dominant side: Secondary | ICD-10-CM | POA: Diagnosis not present

## 2021-07-12 DIAGNOSIS — Z7952 Long term (current) use of systemic steroids: Secondary | ICD-10-CM | POA: Diagnosis not present

## 2021-07-12 DIAGNOSIS — Z9181 History of falling: Secondary | ICD-10-CM | POA: Diagnosis not present

## 2021-07-12 DIAGNOSIS — I619 Nontraumatic intracerebral hemorrhage, unspecified: Secondary | ICD-10-CM | POA: Diagnosis not present

## 2021-07-12 DIAGNOSIS — H547 Unspecified visual loss: Secondary | ICD-10-CM | POA: Diagnosis not present

## 2021-07-12 DIAGNOSIS — Z853 Personal history of malignant neoplasm of breast: Secondary | ICD-10-CM | POA: Diagnosis not present

## 2021-07-13 DIAGNOSIS — H40052 Ocular hypertension, left eye: Secondary | ICD-10-CM | POA: Diagnosis not present

## 2021-07-13 DIAGNOSIS — H5211 Myopia, right eye: Secondary | ICD-10-CM | POA: Diagnosis not present

## 2021-07-14 DIAGNOSIS — E871 Hypo-osmolality and hyponatremia: Secondary | ICD-10-CM | POA: Diagnosis not present

## 2021-07-14 DIAGNOSIS — Z85820 Personal history of malignant melanoma of skin: Secondary | ICD-10-CM | POA: Diagnosis not present

## 2021-07-14 DIAGNOSIS — M1712 Unilateral primary osteoarthritis, left knee: Secondary | ICD-10-CM | POA: Diagnosis not present

## 2021-07-14 DIAGNOSIS — H409 Unspecified glaucoma: Secondary | ICD-10-CM | POA: Diagnosis not present

## 2021-07-14 DIAGNOSIS — I672 Cerebral atherosclerosis: Secondary | ICD-10-CM | POA: Diagnosis not present

## 2021-07-14 DIAGNOSIS — Z7902 Long term (current) use of antithrombotics/antiplatelets: Secondary | ICD-10-CM | POA: Diagnosis not present

## 2021-07-14 DIAGNOSIS — I69354 Hemiplegia and hemiparesis following cerebral infarction affecting left non-dominant side: Secondary | ICD-10-CM | POA: Diagnosis not present

## 2021-07-14 DIAGNOSIS — I10 Essential (primary) hypertension: Secondary | ICD-10-CM | POA: Diagnosis not present

## 2021-07-14 DIAGNOSIS — S8002XD Contusion of left knee, subsequent encounter: Secondary | ICD-10-CM | POA: Diagnosis not present

## 2021-07-14 DIAGNOSIS — K219 Gastro-esophageal reflux disease without esophagitis: Secondary | ICD-10-CM | POA: Diagnosis not present

## 2021-07-14 DIAGNOSIS — Z9012 Acquired absence of left breast and nipple: Secondary | ICD-10-CM | POA: Diagnosis not present

## 2021-07-14 DIAGNOSIS — Z79899 Other long term (current) drug therapy: Secondary | ICD-10-CM | POA: Diagnosis not present

## 2021-07-14 DIAGNOSIS — Z9181 History of falling: Secondary | ICD-10-CM | POA: Diagnosis not present

## 2021-07-14 DIAGNOSIS — M23322 Other meniscus derangements, posterior horn of medial meniscus, left knee: Secondary | ICD-10-CM | POA: Diagnosis not present

## 2021-07-14 DIAGNOSIS — Z7952 Long term (current) use of systemic steroids: Secondary | ICD-10-CM | POA: Diagnosis not present

## 2021-07-14 DIAGNOSIS — I619 Nontraumatic intracerebral hemorrhage, unspecified: Secondary | ICD-10-CM | POA: Diagnosis not present

## 2021-07-14 DIAGNOSIS — S2242XD Multiple fractures of ribs, left side, subsequent encounter for fracture with routine healing: Secondary | ICD-10-CM | POA: Diagnosis not present

## 2021-07-14 DIAGNOSIS — Z853 Personal history of malignant neoplasm of breast: Secondary | ICD-10-CM | POA: Diagnosis not present

## 2021-07-14 DIAGNOSIS — Z791 Long term (current) use of non-steroidal anti-inflammatories (NSAID): Secondary | ICD-10-CM | POA: Diagnosis not present

## 2021-07-14 DIAGNOSIS — H547 Unspecified visual loss: Secondary | ICD-10-CM | POA: Diagnosis not present

## 2021-07-14 DIAGNOSIS — E039 Hypothyroidism, unspecified: Secondary | ICD-10-CM | POA: Diagnosis not present

## 2021-07-14 DIAGNOSIS — M5136 Other intervertebral disc degeneration, lumbar region: Secondary | ICD-10-CM | POA: Diagnosis not present

## 2021-07-14 DIAGNOSIS — F32A Depression, unspecified: Secondary | ICD-10-CM | POA: Diagnosis not present

## 2021-07-14 DIAGNOSIS — Z7982 Long term (current) use of aspirin: Secondary | ICD-10-CM | POA: Diagnosis not present

## 2021-07-19 DIAGNOSIS — K219 Gastro-esophageal reflux disease without esophagitis: Secondary | ICD-10-CM | POA: Diagnosis not present

## 2021-07-19 DIAGNOSIS — Z791 Long term (current) use of non-steroidal anti-inflammatories (NSAID): Secondary | ICD-10-CM | POA: Diagnosis not present

## 2021-07-19 DIAGNOSIS — F32A Depression, unspecified: Secondary | ICD-10-CM | POA: Diagnosis not present

## 2021-07-19 DIAGNOSIS — Z85820 Personal history of malignant melanoma of skin: Secondary | ICD-10-CM | POA: Diagnosis not present

## 2021-07-19 DIAGNOSIS — H409 Unspecified glaucoma: Secondary | ICD-10-CM | POA: Diagnosis not present

## 2021-07-19 DIAGNOSIS — M1712 Unilateral primary osteoarthritis, left knee: Secondary | ICD-10-CM | POA: Diagnosis not present

## 2021-07-19 DIAGNOSIS — Z853 Personal history of malignant neoplasm of breast: Secondary | ICD-10-CM | POA: Diagnosis not present

## 2021-07-19 DIAGNOSIS — Z7982 Long term (current) use of aspirin: Secondary | ICD-10-CM | POA: Diagnosis not present

## 2021-07-19 DIAGNOSIS — I10 Essential (primary) hypertension: Secondary | ICD-10-CM | POA: Diagnosis not present

## 2021-07-19 DIAGNOSIS — Z9012 Acquired absence of left breast and nipple: Secondary | ICD-10-CM | POA: Diagnosis not present

## 2021-07-19 DIAGNOSIS — E039 Hypothyroidism, unspecified: Secondary | ICD-10-CM | POA: Diagnosis not present

## 2021-07-19 DIAGNOSIS — Z7902 Long term (current) use of antithrombotics/antiplatelets: Secondary | ICD-10-CM | POA: Diagnosis not present

## 2021-07-19 DIAGNOSIS — M23322 Other meniscus derangements, posterior horn of medial meniscus, left knee: Secondary | ICD-10-CM | POA: Diagnosis not present

## 2021-07-19 DIAGNOSIS — H547 Unspecified visual loss: Secondary | ICD-10-CM | POA: Diagnosis not present

## 2021-07-19 DIAGNOSIS — Z9181 History of falling: Secondary | ICD-10-CM | POA: Diagnosis not present

## 2021-07-19 DIAGNOSIS — I69354 Hemiplegia and hemiparesis following cerebral infarction affecting left non-dominant side: Secondary | ICD-10-CM | POA: Diagnosis not present

## 2021-07-19 DIAGNOSIS — M5136 Other intervertebral disc degeneration, lumbar region: Secondary | ICD-10-CM | POA: Diagnosis not present

## 2021-07-19 DIAGNOSIS — S2242XD Multiple fractures of ribs, left side, subsequent encounter for fracture with routine healing: Secondary | ICD-10-CM | POA: Diagnosis not present

## 2021-07-19 DIAGNOSIS — Z7952 Long term (current) use of systemic steroids: Secondary | ICD-10-CM | POA: Diagnosis not present

## 2021-07-19 DIAGNOSIS — S8002XD Contusion of left knee, subsequent encounter: Secondary | ICD-10-CM | POA: Diagnosis not present

## 2021-07-19 DIAGNOSIS — E871 Hypo-osmolality and hyponatremia: Secondary | ICD-10-CM | POA: Diagnosis not present

## 2021-07-19 DIAGNOSIS — I619 Nontraumatic intracerebral hemorrhage, unspecified: Secondary | ICD-10-CM | POA: Diagnosis not present

## 2021-07-20 DIAGNOSIS — I6389 Other cerebral infarction: Secondary | ICD-10-CM | POA: Diagnosis not present

## 2021-07-21 DIAGNOSIS — E871 Hypo-osmolality and hyponatremia: Secondary | ICD-10-CM | POA: Diagnosis not present

## 2021-07-21 DIAGNOSIS — H409 Unspecified glaucoma: Secondary | ICD-10-CM | POA: Diagnosis not present

## 2021-07-21 DIAGNOSIS — Z7952 Long term (current) use of systemic steroids: Secondary | ICD-10-CM | POA: Diagnosis not present

## 2021-07-21 DIAGNOSIS — E039 Hypothyroidism, unspecified: Secondary | ICD-10-CM | POA: Diagnosis not present

## 2021-07-21 DIAGNOSIS — M1712 Unilateral primary osteoarthritis, left knee: Secondary | ICD-10-CM | POA: Diagnosis not present

## 2021-07-21 DIAGNOSIS — I10 Essential (primary) hypertension: Secondary | ICD-10-CM | POA: Diagnosis not present

## 2021-07-21 DIAGNOSIS — Z791 Long term (current) use of non-steroidal anti-inflammatories (NSAID): Secondary | ICD-10-CM | POA: Diagnosis not present

## 2021-07-21 DIAGNOSIS — M5136 Other intervertebral disc degeneration, lumbar region: Secondary | ICD-10-CM | POA: Diagnosis not present

## 2021-07-21 DIAGNOSIS — I69354 Hemiplegia and hemiparesis following cerebral infarction affecting left non-dominant side: Secondary | ICD-10-CM | POA: Diagnosis not present

## 2021-07-21 DIAGNOSIS — Z7902 Long term (current) use of antithrombotics/antiplatelets: Secondary | ICD-10-CM | POA: Diagnosis not present

## 2021-07-21 DIAGNOSIS — K219 Gastro-esophageal reflux disease without esophagitis: Secondary | ICD-10-CM | POA: Diagnosis not present

## 2021-07-21 DIAGNOSIS — Z9012 Acquired absence of left breast and nipple: Secondary | ICD-10-CM | POA: Diagnosis not present

## 2021-07-21 DIAGNOSIS — M23322 Other meniscus derangements, posterior horn of medial meniscus, left knee: Secondary | ICD-10-CM | POA: Diagnosis not present

## 2021-07-21 DIAGNOSIS — Z9181 History of falling: Secondary | ICD-10-CM | POA: Diagnosis not present

## 2021-07-21 DIAGNOSIS — S8002XD Contusion of left knee, subsequent encounter: Secondary | ICD-10-CM | POA: Diagnosis not present

## 2021-07-21 DIAGNOSIS — Z853 Personal history of malignant neoplasm of breast: Secondary | ICD-10-CM | POA: Diagnosis not present

## 2021-07-21 DIAGNOSIS — I619 Nontraumatic intracerebral hemorrhage, unspecified: Secondary | ICD-10-CM | POA: Diagnosis not present

## 2021-07-21 DIAGNOSIS — Z7982 Long term (current) use of aspirin: Secondary | ICD-10-CM | POA: Diagnosis not present

## 2021-07-21 DIAGNOSIS — S2242XD Multiple fractures of ribs, left side, subsequent encounter for fracture with routine healing: Secondary | ICD-10-CM | POA: Diagnosis not present

## 2021-07-21 DIAGNOSIS — Z85820 Personal history of malignant melanoma of skin: Secondary | ICD-10-CM | POA: Diagnosis not present

## 2021-07-21 DIAGNOSIS — H547 Unspecified visual loss: Secondary | ICD-10-CM | POA: Diagnosis not present

## 2021-07-21 DIAGNOSIS — F32A Depression, unspecified: Secondary | ICD-10-CM | POA: Diagnosis not present

## 2021-07-27 DIAGNOSIS — I619 Nontraumatic intracerebral hemorrhage, unspecified: Secondary | ICD-10-CM | POA: Diagnosis not present

## 2021-07-27 DIAGNOSIS — I10 Essential (primary) hypertension: Secondary | ICD-10-CM | POA: Diagnosis not present

## 2021-07-27 DIAGNOSIS — Z7982 Long term (current) use of aspirin: Secondary | ICD-10-CM | POA: Diagnosis not present

## 2021-07-27 DIAGNOSIS — K219 Gastro-esophageal reflux disease without esophagitis: Secondary | ICD-10-CM | POA: Diagnosis not present

## 2021-07-27 DIAGNOSIS — H547 Unspecified visual loss: Secondary | ICD-10-CM | POA: Diagnosis not present

## 2021-07-27 DIAGNOSIS — Z791 Long term (current) use of non-steroidal anti-inflammatories (NSAID): Secondary | ICD-10-CM | POA: Diagnosis not present

## 2021-07-27 DIAGNOSIS — H409 Unspecified glaucoma: Secondary | ICD-10-CM | POA: Diagnosis not present

## 2021-07-27 DIAGNOSIS — Z9012 Acquired absence of left breast and nipple: Secondary | ICD-10-CM | POA: Diagnosis not present

## 2021-07-27 DIAGNOSIS — Z853 Personal history of malignant neoplasm of breast: Secondary | ICD-10-CM | POA: Diagnosis not present

## 2021-07-27 DIAGNOSIS — M5136 Other intervertebral disc degeneration, lumbar region: Secondary | ICD-10-CM | POA: Diagnosis not present

## 2021-07-27 DIAGNOSIS — Z7902 Long term (current) use of antithrombotics/antiplatelets: Secondary | ICD-10-CM | POA: Diagnosis not present

## 2021-07-27 DIAGNOSIS — E039 Hypothyroidism, unspecified: Secondary | ICD-10-CM | POA: Diagnosis not present

## 2021-07-27 DIAGNOSIS — Z9181 History of falling: Secondary | ICD-10-CM | POA: Diagnosis not present

## 2021-07-27 DIAGNOSIS — S8002XD Contusion of left knee, subsequent encounter: Secondary | ICD-10-CM | POA: Diagnosis not present

## 2021-07-27 DIAGNOSIS — S2242XD Multiple fractures of ribs, left side, subsequent encounter for fracture with routine healing: Secondary | ICD-10-CM | POA: Diagnosis not present

## 2021-07-27 DIAGNOSIS — M23322 Other meniscus derangements, posterior horn of medial meniscus, left knee: Secondary | ICD-10-CM | POA: Diagnosis not present

## 2021-07-27 DIAGNOSIS — Z7952 Long term (current) use of systemic steroids: Secondary | ICD-10-CM | POA: Diagnosis not present

## 2021-07-27 DIAGNOSIS — F32A Depression, unspecified: Secondary | ICD-10-CM | POA: Diagnosis not present

## 2021-07-27 DIAGNOSIS — E871 Hypo-osmolality and hyponatremia: Secondary | ICD-10-CM | POA: Diagnosis not present

## 2021-07-27 DIAGNOSIS — M1712 Unilateral primary osteoarthritis, left knee: Secondary | ICD-10-CM | POA: Diagnosis not present

## 2021-07-27 DIAGNOSIS — Z85820 Personal history of malignant melanoma of skin: Secondary | ICD-10-CM | POA: Diagnosis not present

## 2021-07-27 DIAGNOSIS — I69354 Hemiplegia and hemiparesis following cerebral infarction affecting left non-dominant side: Secondary | ICD-10-CM | POA: Diagnosis not present

## 2021-08-02 DIAGNOSIS — M25552 Pain in left hip: Secondary | ICD-10-CM | POA: Diagnosis not present

## 2021-08-02 DIAGNOSIS — M25562 Pain in left knee: Secondary | ICD-10-CM | POA: Diagnosis not present

## 2021-08-02 DIAGNOSIS — M4727 Other spondylosis with radiculopathy, lumbosacral region: Secondary | ICD-10-CM | POA: Diagnosis not present

## 2021-08-02 DIAGNOSIS — M545 Low back pain, unspecified: Secondary | ICD-10-CM | POA: Diagnosis not present

## 2021-08-02 DIAGNOSIS — M25551 Pain in right hip: Secondary | ICD-10-CM | POA: Diagnosis not present

## 2021-08-02 DIAGNOSIS — Z6824 Body mass index (BMI) 24.0-24.9, adult: Secondary | ICD-10-CM | POA: Diagnosis not present

## 2021-08-04 DIAGNOSIS — Z7952 Long term (current) use of systemic steroids: Secondary | ICD-10-CM | POA: Diagnosis not present

## 2021-08-04 DIAGNOSIS — Z9012 Acquired absence of left breast and nipple: Secondary | ICD-10-CM | POA: Diagnosis not present

## 2021-08-04 DIAGNOSIS — Z7982 Long term (current) use of aspirin: Secondary | ICD-10-CM | POA: Diagnosis not present

## 2021-08-04 DIAGNOSIS — I639 Cerebral infarction, unspecified: Secondary | ICD-10-CM | POA: Diagnosis not present

## 2021-08-04 DIAGNOSIS — H547 Unspecified visual loss: Secondary | ICD-10-CM | POA: Diagnosis not present

## 2021-08-04 DIAGNOSIS — Z7902 Long term (current) use of antithrombotics/antiplatelets: Secondary | ICD-10-CM | POA: Diagnosis not present

## 2021-08-04 DIAGNOSIS — E039 Hypothyroidism, unspecified: Secondary | ICD-10-CM | POA: Diagnosis not present

## 2021-08-04 DIAGNOSIS — Z791 Long term (current) use of non-steroidal anti-inflammatories (NSAID): Secondary | ICD-10-CM | POA: Diagnosis not present

## 2021-08-04 DIAGNOSIS — S2242XD Multiple fractures of ribs, left side, subsequent encounter for fracture with routine healing: Secondary | ICD-10-CM | POA: Diagnosis not present

## 2021-08-04 DIAGNOSIS — Z9181 History of falling: Secondary | ICD-10-CM | POA: Diagnosis not present

## 2021-08-04 DIAGNOSIS — I69354 Hemiplegia and hemiparesis following cerebral infarction affecting left non-dominant side: Secondary | ICD-10-CM | POA: Diagnosis not present

## 2021-08-04 DIAGNOSIS — E871 Hypo-osmolality and hyponatremia: Secondary | ICD-10-CM | POA: Diagnosis not present

## 2021-08-04 DIAGNOSIS — Z853 Personal history of malignant neoplasm of breast: Secondary | ICD-10-CM | POA: Diagnosis not present

## 2021-08-04 DIAGNOSIS — Z85820 Personal history of malignant melanoma of skin: Secondary | ICD-10-CM | POA: Diagnosis not present

## 2021-08-04 DIAGNOSIS — S8002XD Contusion of left knee, subsequent encounter: Secondary | ICD-10-CM | POA: Diagnosis not present

## 2021-08-04 DIAGNOSIS — I619 Nontraumatic intracerebral hemorrhage, unspecified: Secondary | ICD-10-CM | POA: Diagnosis not present

## 2021-08-04 DIAGNOSIS — I10 Essential (primary) hypertension: Secondary | ICD-10-CM | POA: Diagnosis not present

## 2021-08-04 DIAGNOSIS — M1712 Unilateral primary osteoarthritis, left knee: Secondary | ICD-10-CM | POA: Diagnosis not present

## 2021-08-04 DIAGNOSIS — F32A Depression, unspecified: Secondary | ICD-10-CM | POA: Diagnosis not present

## 2021-08-04 DIAGNOSIS — H409 Unspecified glaucoma: Secondary | ICD-10-CM | POA: Diagnosis not present

## 2021-08-04 DIAGNOSIS — M23322 Other meniscus derangements, posterior horn of medial meniscus, left knee: Secondary | ICD-10-CM | POA: Diagnosis not present

## 2021-08-04 DIAGNOSIS — K219 Gastro-esophageal reflux disease without esophagitis: Secondary | ICD-10-CM | POA: Diagnosis not present

## 2021-08-04 DIAGNOSIS — M5136 Other intervertebral disc degeneration, lumbar region: Secondary | ICD-10-CM | POA: Diagnosis not present

## 2021-08-08 DIAGNOSIS — E782 Mixed hyperlipidemia: Secondary | ICD-10-CM | POA: Diagnosis not present

## 2021-08-08 DIAGNOSIS — B351 Tinea unguium: Secondary | ICD-10-CM | POA: Diagnosis not present

## 2021-08-12 DIAGNOSIS — M23322 Other meniscus derangements, posterior horn of medial meniscus, left knee: Secondary | ICD-10-CM | POA: Diagnosis not present

## 2021-08-12 DIAGNOSIS — E871 Hypo-osmolality and hyponatremia: Secondary | ICD-10-CM | POA: Diagnosis not present

## 2021-08-12 DIAGNOSIS — H547 Unspecified visual loss: Secondary | ICD-10-CM | POA: Diagnosis not present

## 2021-08-12 DIAGNOSIS — I10 Essential (primary) hypertension: Secondary | ICD-10-CM | POA: Diagnosis not present

## 2021-08-12 DIAGNOSIS — Z9181 History of falling: Secondary | ICD-10-CM | POA: Diagnosis not present

## 2021-08-12 DIAGNOSIS — Z9012 Acquired absence of left breast and nipple: Secondary | ICD-10-CM | POA: Diagnosis not present

## 2021-08-12 DIAGNOSIS — S2242XD Multiple fractures of ribs, left side, subsequent encounter for fracture with routine healing: Secondary | ICD-10-CM | POA: Diagnosis not present

## 2021-08-12 DIAGNOSIS — Z853 Personal history of malignant neoplasm of breast: Secondary | ICD-10-CM | POA: Diagnosis not present

## 2021-08-12 DIAGNOSIS — Z7982 Long term (current) use of aspirin: Secondary | ICD-10-CM | POA: Diagnosis not present

## 2021-08-12 DIAGNOSIS — H409 Unspecified glaucoma: Secondary | ICD-10-CM | POA: Diagnosis not present

## 2021-08-12 DIAGNOSIS — E039 Hypothyroidism, unspecified: Secondary | ICD-10-CM | POA: Diagnosis not present

## 2021-08-12 DIAGNOSIS — Z7952 Long term (current) use of systemic steroids: Secondary | ICD-10-CM | POA: Diagnosis not present

## 2021-08-12 DIAGNOSIS — K219 Gastro-esophageal reflux disease without esophagitis: Secondary | ICD-10-CM | POA: Diagnosis not present

## 2021-08-12 DIAGNOSIS — M1712 Unilateral primary osteoarthritis, left knee: Secondary | ICD-10-CM | POA: Diagnosis not present

## 2021-08-12 DIAGNOSIS — Z791 Long term (current) use of non-steroidal anti-inflammatories (NSAID): Secondary | ICD-10-CM | POA: Diagnosis not present

## 2021-08-12 DIAGNOSIS — S8002XD Contusion of left knee, subsequent encounter: Secondary | ICD-10-CM | POA: Diagnosis not present

## 2021-08-12 DIAGNOSIS — M5136 Other intervertebral disc degeneration, lumbar region: Secondary | ICD-10-CM | POA: Diagnosis not present

## 2021-08-12 DIAGNOSIS — I619 Nontraumatic intracerebral hemorrhage, unspecified: Secondary | ICD-10-CM | POA: Diagnosis not present

## 2021-08-12 DIAGNOSIS — Z85820 Personal history of malignant melanoma of skin: Secondary | ICD-10-CM | POA: Diagnosis not present

## 2021-08-12 DIAGNOSIS — F32A Depression, unspecified: Secondary | ICD-10-CM | POA: Diagnosis not present

## 2021-08-12 DIAGNOSIS — I69354 Hemiplegia and hemiparesis following cerebral infarction affecting left non-dominant side: Secondary | ICD-10-CM | POA: Diagnosis not present

## 2021-08-12 DIAGNOSIS — Z7902 Long term (current) use of antithrombotics/antiplatelets: Secondary | ICD-10-CM | POA: Diagnosis not present

## 2021-08-16 DIAGNOSIS — Z6824 Body mass index (BMI) 24.0-24.9, adult: Secondary | ICD-10-CM | POA: Diagnosis not present

## 2021-08-16 DIAGNOSIS — L609 Nail disorder, unspecified: Secondary | ICD-10-CM | POA: Diagnosis not present

## 2021-08-16 DIAGNOSIS — L601 Onycholysis: Secondary | ICD-10-CM | POA: Diagnosis not present

## 2021-08-16 DIAGNOSIS — M1991 Primary osteoarthritis, unspecified site: Secondary | ICD-10-CM | POA: Diagnosis not present

## 2021-08-17 DIAGNOSIS — Z7902 Long term (current) use of antithrombotics/antiplatelets: Secondary | ICD-10-CM | POA: Diagnosis not present

## 2021-08-17 DIAGNOSIS — H547 Unspecified visual loss: Secondary | ICD-10-CM | POA: Diagnosis not present

## 2021-08-17 DIAGNOSIS — Z7982 Long term (current) use of aspirin: Secondary | ICD-10-CM | POA: Diagnosis not present

## 2021-08-17 DIAGNOSIS — Z9181 History of falling: Secondary | ICD-10-CM | POA: Diagnosis not present

## 2021-08-17 DIAGNOSIS — M23322 Other meniscus derangements, posterior horn of medial meniscus, left knee: Secondary | ICD-10-CM | POA: Diagnosis not present

## 2021-08-17 DIAGNOSIS — I69354 Hemiplegia and hemiparesis following cerebral infarction affecting left non-dominant side: Secondary | ICD-10-CM | POA: Diagnosis not present

## 2021-08-17 DIAGNOSIS — M1712 Unilateral primary osteoarthritis, left knee: Secondary | ICD-10-CM | POA: Diagnosis not present

## 2021-08-17 DIAGNOSIS — E871 Hypo-osmolality and hyponatremia: Secondary | ICD-10-CM | POA: Diagnosis not present

## 2021-08-17 DIAGNOSIS — S8002XD Contusion of left knee, subsequent encounter: Secondary | ICD-10-CM | POA: Diagnosis not present

## 2021-08-17 DIAGNOSIS — Z853 Personal history of malignant neoplasm of breast: Secondary | ICD-10-CM | POA: Diagnosis not present

## 2021-08-17 DIAGNOSIS — I619 Nontraumatic intracerebral hemorrhage, unspecified: Secondary | ICD-10-CM | POA: Diagnosis not present

## 2021-08-17 DIAGNOSIS — M21372 Foot drop, left foot: Secondary | ICD-10-CM | POA: Diagnosis not present

## 2021-08-17 DIAGNOSIS — Z791 Long term (current) use of non-steroidal anti-inflammatories (NSAID): Secondary | ICD-10-CM | POA: Diagnosis not present

## 2021-08-17 DIAGNOSIS — Z7952 Long term (current) use of systemic steroids: Secondary | ICD-10-CM | POA: Diagnosis not present

## 2021-08-17 DIAGNOSIS — M5136 Other intervertebral disc degeneration, lumbar region: Secondary | ICD-10-CM | POA: Diagnosis not present

## 2021-08-17 DIAGNOSIS — S2242XD Multiple fractures of ribs, left side, subsequent encounter for fracture with routine healing: Secondary | ICD-10-CM | POA: Diagnosis not present

## 2021-08-17 DIAGNOSIS — K219 Gastro-esophageal reflux disease without esophagitis: Secondary | ICD-10-CM | POA: Diagnosis not present

## 2021-08-17 DIAGNOSIS — Z85820 Personal history of malignant melanoma of skin: Secondary | ICD-10-CM | POA: Diagnosis not present

## 2021-08-17 DIAGNOSIS — I10 Essential (primary) hypertension: Secondary | ICD-10-CM | POA: Diagnosis not present

## 2021-08-17 DIAGNOSIS — H409 Unspecified glaucoma: Secondary | ICD-10-CM | POA: Diagnosis not present

## 2021-08-17 DIAGNOSIS — E039 Hypothyroidism, unspecified: Secondary | ICD-10-CM | POA: Diagnosis not present

## 2021-08-17 DIAGNOSIS — Z9012 Acquired absence of left breast and nipple: Secondary | ICD-10-CM | POA: Diagnosis not present

## 2021-08-17 DIAGNOSIS — F32A Depression, unspecified: Secondary | ICD-10-CM | POA: Diagnosis not present

## 2021-08-22 DIAGNOSIS — I619 Nontraumatic intracerebral hemorrhage, unspecified: Secondary | ICD-10-CM | POA: Diagnosis not present

## 2021-08-23 DIAGNOSIS — Z9012 Acquired absence of left breast and nipple: Secondary | ICD-10-CM | POA: Diagnosis not present

## 2021-08-23 DIAGNOSIS — Z85820 Personal history of malignant melanoma of skin: Secondary | ICD-10-CM | POA: Diagnosis not present

## 2021-08-23 DIAGNOSIS — E871 Hypo-osmolality and hyponatremia: Secondary | ICD-10-CM | POA: Diagnosis not present

## 2021-08-23 DIAGNOSIS — Z791 Long term (current) use of non-steroidal anti-inflammatories (NSAID): Secondary | ICD-10-CM | POA: Diagnosis not present

## 2021-08-23 DIAGNOSIS — M1712 Unilateral primary osteoarthritis, left knee: Secondary | ICD-10-CM | POA: Diagnosis not present

## 2021-08-23 DIAGNOSIS — M5136 Other intervertebral disc degeneration, lumbar region: Secondary | ICD-10-CM | POA: Diagnosis not present

## 2021-08-23 DIAGNOSIS — Z853 Personal history of malignant neoplasm of breast: Secondary | ICD-10-CM | POA: Diagnosis not present

## 2021-08-23 DIAGNOSIS — S2242XD Multiple fractures of ribs, left side, subsequent encounter for fracture with routine healing: Secondary | ICD-10-CM | POA: Diagnosis not present

## 2021-08-23 DIAGNOSIS — E039 Hypothyroidism, unspecified: Secondary | ICD-10-CM | POA: Diagnosis not present

## 2021-08-23 DIAGNOSIS — Z7902 Long term (current) use of antithrombotics/antiplatelets: Secondary | ICD-10-CM | POA: Diagnosis not present

## 2021-08-23 DIAGNOSIS — S8002XD Contusion of left knee, subsequent encounter: Secondary | ICD-10-CM | POA: Diagnosis not present

## 2021-08-23 DIAGNOSIS — I619 Nontraumatic intracerebral hemorrhage, unspecified: Secondary | ICD-10-CM | POA: Diagnosis not present

## 2021-08-23 DIAGNOSIS — Z7982 Long term (current) use of aspirin: Secondary | ICD-10-CM | POA: Diagnosis not present

## 2021-08-23 DIAGNOSIS — H409 Unspecified glaucoma: Secondary | ICD-10-CM | POA: Diagnosis not present

## 2021-08-23 DIAGNOSIS — F32A Depression, unspecified: Secondary | ICD-10-CM | POA: Diagnosis not present

## 2021-08-23 DIAGNOSIS — I10 Essential (primary) hypertension: Secondary | ICD-10-CM | POA: Diagnosis not present

## 2021-08-23 DIAGNOSIS — Z7952 Long term (current) use of systemic steroids: Secondary | ICD-10-CM | POA: Diagnosis not present

## 2021-08-23 DIAGNOSIS — I69354 Hemiplegia and hemiparesis following cerebral infarction affecting left non-dominant side: Secondary | ICD-10-CM | POA: Diagnosis not present

## 2021-08-23 DIAGNOSIS — K219 Gastro-esophageal reflux disease without esophagitis: Secondary | ICD-10-CM | POA: Diagnosis not present

## 2021-08-23 DIAGNOSIS — Z9181 History of falling: Secondary | ICD-10-CM | POA: Diagnosis not present

## 2021-08-23 DIAGNOSIS — H547 Unspecified visual loss: Secondary | ICD-10-CM | POA: Diagnosis not present

## 2021-08-23 DIAGNOSIS — M23322 Other meniscus derangements, posterior horn of medial meniscus, left knee: Secondary | ICD-10-CM | POA: Diagnosis not present

## 2021-08-25 DIAGNOSIS — J301 Allergic rhinitis due to pollen: Secondary | ICD-10-CM | POA: Diagnosis not present

## 2021-08-29 DIAGNOSIS — Z853 Personal history of malignant neoplasm of breast: Secondary | ICD-10-CM | POA: Diagnosis not present

## 2021-08-29 DIAGNOSIS — S2242XD Multiple fractures of ribs, left side, subsequent encounter for fracture with routine healing: Secondary | ICD-10-CM | POA: Diagnosis not present

## 2021-08-29 DIAGNOSIS — S8002XD Contusion of left knee, subsequent encounter: Secondary | ICD-10-CM | POA: Diagnosis not present

## 2021-08-29 DIAGNOSIS — K219 Gastro-esophageal reflux disease without esophagitis: Secondary | ICD-10-CM | POA: Diagnosis not present

## 2021-08-29 DIAGNOSIS — M5136 Other intervertebral disc degeneration, lumbar region: Secondary | ICD-10-CM | POA: Diagnosis not present

## 2021-08-29 DIAGNOSIS — Z7982 Long term (current) use of aspirin: Secondary | ICD-10-CM | POA: Diagnosis not present

## 2021-08-29 DIAGNOSIS — Z7952 Long term (current) use of systemic steroids: Secondary | ICD-10-CM | POA: Diagnosis not present

## 2021-08-29 DIAGNOSIS — F32A Depression, unspecified: Secondary | ICD-10-CM | POA: Diagnosis not present

## 2021-08-29 DIAGNOSIS — M23322 Other meniscus derangements, posterior horn of medial meniscus, left knee: Secondary | ICD-10-CM | POA: Diagnosis not present

## 2021-08-29 DIAGNOSIS — H547 Unspecified visual loss: Secondary | ICD-10-CM | POA: Diagnosis not present

## 2021-08-29 DIAGNOSIS — Z85820 Personal history of malignant melanoma of skin: Secondary | ICD-10-CM | POA: Diagnosis not present

## 2021-08-29 DIAGNOSIS — Z7902 Long term (current) use of antithrombotics/antiplatelets: Secondary | ICD-10-CM | POA: Diagnosis not present

## 2021-08-29 DIAGNOSIS — Z9012 Acquired absence of left breast and nipple: Secondary | ICD-10-CM | POA: Diagnosis not present

## 2021-08-29 DIAGNOSIS — H409 Unspecified glaucoma: Secondary | ICD-10-CM | POA: Diagnosis not present

## 2021-08-29 DIAGNOSIS — I619 Nontraumatic intracerebral hemorrhage, unspecified: Secondary | ICD-10-CM | POA: Diagnosis not present

## 2021-08-29 DIAGNOSIS — Z791 Long term (current) use of non-steroidal anti-inflammatories (NSAID): Secondary | ICD-10-CM | POA: Diagnosis not present

## 2021-08-29 DIAGNOSIS — E871 Hypo-osmolality and hyponatremia: Secondary | ICD-10-CM | POA: Diagnosis not present

## 2021-08-29 DIAGNOSIS — I69354 Hemiplegia and hemiparesis following cerebral infarction affecting left non-dominant side: Secondary | ICD-10-CM | POA: Diagnosis not present

## 2021-08-29 DIAGNOSIS — Z9181 History of falling: Secondary | ICD-10-CM | POA: Diagnosis not present

## 2021-08-29 DIAGNOSIS — M1712 Unilateral primary osteoarthritis, left knee: Secondary | ICD-10-CM | POA: Diagnosis not present

## 2021-08-29 DIAGNOSIS — I10 Essential (primary) hypertension: Secondary | ICD-10-CM | POA: Diagnosis not present

## 2021-08-29 DIAGNOSIS — E039 Hypothyroidism, unspecified: Secondary | ICD-10-CM | POA: Diagnosis not present

## 2021-08-30 DIAGNOSIS — I6389 Other cerebral infarction: Secondary | ICD-10-CM | POA: Diagnosis not present

## 2021-09-05 DIAGNOSIS — Z9181 History of falling: Secondary | ICD-10-CM | POA: Diagnosis not present

## 2021-09-05 DIAGNOSIS — E871 Hypo-osmolality and hyponatremia: Secondary | ICD-10-CM | POA: Diagnosis not present

## 2021-09-05 DIAGNOSIS — H547 Unspecified visual loss: Secondary | ICD-10-CM | POA: Diagnosis not present

## 2021-09-05 DIAGNOSIS — F32A Depression, unspecified: Secondary | ICD-10-CM | POA: Diagnosis not present

## 2021-09-05 DIAGNOSIS — I69354 Hemiplegia and hemiparesis following cerebral infarction affecting left non-dominant side: Secondary | ICD-10-CM | POA: Diagnosis not present

## 2021-09-05 DIAGNOSIS — Z7902 Long term (current) use of antithrombotics/antiplatelets: Secondary | ICD-10-CM | POA: Diagnosis not present

## 2021-09-05 DIAGNOSIS — Z791 Long term (current) use of non-steroidal anti-inflammatories (NSAID): Secondary | ICD-10-CM | POA: Diagnosis not present

## 2021-09-05 DIAGNOSIS — Z85820 Personal history of malignant melanoma of skin: Secondary | ICD-10-CM | POA: Diagnosis not present

## 2021-09-05 DIAGNOSIS — E039 Hypothyroidism, unspecified: Secondary | ICD-10-CM | POA: Diagnosis not present

## 2021-09-05 DIAGNOSIS — Z7982 Long term (current) use of aspirin: Secondary | ICD-10-CM | POA: Diagnosis not present

## 2021-09-05 DIAGNOSIS — M1712 Unilateral primary osteoarthritis, left knee: Secondary | ICD-10-CM | POA: Diagnosis not present

## 2021-09-05 DIAGNOSIS — S2242XD Multiple fractures of ribs, left side, subsequent encounter for fracture with routine healing: Secondary | ICD-10-CM | POA: Diagnosis not present

## 2021-09-05 DIAGNOSIS — S8002XD Contusion of left knee, subsequent encounter: Secondary | ICD-10-CM | POA: Diagnosis not present

## 2021-09-05 DIAGNOSIS — K219 Gastro-esophageal reflux disease without esophagitis: Secondary | ICD-10-CM | POA: Diagnosis not present

## 2021-09-05 DIAGNOSIS — M5136 Other intervertebral disc degeneration, lumbar region: Secondary | ICD-10-CM | POA: Diagnosis not present

## 2021-09-05 DIAGNOSIS — Z9012 Acquired absence of left breast and nipple: Secondary | ICD-10-CM | POA: Diagnosis not present

## 2021-09-05 DIAGNOSIS — Z7952 Long term (current) use of systemic steroids: Secondary | ICD-10-CM | POA: Diagnosis not present

## 2021-09-05 DIAGNOSIS — I619 Nontraumatic intracerebral hemorrhage, unspecified: Secondary | ICD-10-CM | POA: Diagnosis not present

## 2021-09-05 DIAGNOSIS — Z853 Personal history of malignant neoplasm of breast: Secondary | ICD-10-CM | POA: Diagnosis not present

## 2021-09-05 DIAGNOSIS — M23322 Other meniscus derangements, posterior horn of medial meniscus, left knee: Secondary | ICD-10-CM | POA: Diagnosis not present

## 2021-09-05 DIAGNOSIS — I10 Essential (primary) hypertension: Secondary | ICD-10-CM | POA: Diagnosis not present

## 2021-09-05 DIAGNOSIS — H409 Unspecified glaucoma: Secondary | ICD-10-CM | POA: Diagnosis not present

## 2021-09-13 DIAGNOSIS — I639 Cerebral infarction, unspecified: Secondary | ICD-10-CM | POA: Diagnosis not present

## 2021-09-13 DIAGNOSIS — I471 Supraventricular tachycardia: Secondary | ICD-10-CM | POA: Diagnosis not present

## 2021-09-13 DIAGNOSIS — I340 Nonrheumatic mitral (valve) insufficiency: Secondary | ICD-10-CM | POA: Diagnosis not present

## 2021-09-13 DIAGNOSIS — I10 Essential (primary) hypertension: Secondary | ICD-10-CM | POA: Diagnosis not present

## 2021-09-13 DIAGNOSIS — I619 Nontraumatic intracerebral hemorrhage, unspecified: Secondary | ICD-10-CM | POA: Diagnosis not present

## 2021-09-13 DIAGNOSIS — E785 Hyperlipidemia, unspecified: Secondary | ICD-10-CM | POA: Diagnosis not present

## 2021-09-13 DIAGNOSIS — I447 Left bundle-branch block, unspecified: Secondary | ICD-10-CM | POA: Diagnosis not present

## 2021-09-22 ENCOUNTER — Other Ambulatory Visit: Payer: Self-pay | Admitting: Oncology

## 2021-09-22 DIAGNOSIS — C50212 Malignant neoplasm of upper-inner quadrant of left female breast: Secondary | ICD-10-CM

## 2021-09-22 NOTE — Progress Notes (Deleted)
[place]  [address] [place],  [place]  [address] [phone]  Clinic Day:  09/22/2021  Referring physician: Venetia Maxon, Sharon Mt, *   This document serves as a record of services personally performed by Hosie Poisson, MD. It was created on their behalf by Curry,Lauren E, a trained medical scribe. The creation of this record is based on the scribe's personal observations and the provider's statements to them.   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  She was treated with lumpectomy.  Pathology revealed a 0.5 cm invasive ductal carcinoma in the background of high-grade ductal carcinoma in situ with a negative sentinel node.  Estrogen and progesterone receptors were positive and HER 2 negative.  Adjuvant chemotherapy was not recommended.  She did received postoperative radiation to the left breast and then was placed on anastrozole 1 mg daily in May 2012. She to not tolerate anastrozole due to malaise, so was switched to letrozole and did not tolerate that either.  Eventually she refused all hormonal therapy.  She underwent cholecystectomy in December 2016 and was in the hospital for 7 days.    We scheduled her for mammogram in February 2019, which revealed 2 suspicious masses in the right breast and biopsy was recommended.  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  The imaging appeared to be a 1.5 cm lesion.  Estrogen and progesterone receptors were highly positive with HER 2 negative, and a Ki 67 of 25%.  A second biopsy at 1 o'clock was benign, but felt to be discordant.  We had been  concerned about her memory.  She underwent right mastectomy in April 2019.  Pathology revealed a multicentric invasive ductal carcinoma with 5 negative nodes.  One lesion was grade 3 and 15 mm in size, and the other was grade 1 and 13 mm lesion in size, for a stage IA (T1c N0 M0) hormone receptor positive right breast cancer.  Bone density scan in June 2019 revealed worsening osteopenia of the femur for a T-score of -2.4 and stable osteopenia of the spine with a T-score of -1.9.  She was placed on adjuvant hormonal therapy with letrozole 2.5 mg in June 2019. She initially tolerated this fairly well, but later stopped it on her own.  In August 2019, she develops to small nodules of the right mastectomy site.  Biopsy of both of these in September 2019 revealed fat necrosis.  Bone density scan from June revealed osteoporosis with a T-score of -2.7 of the right femur, previously -2.2, and a T-score of-2.4 in the spine, previously -1.9.  Left screening mammogram in July did not reveal any evidence of malignancy. Due to the osteoporosis, we recommended that she be placed on Prolia every 6 months and she received her 1st dose on July 19th.  She has been on and off hormonal therapy with letrozole and anastrozole, but currently is compliant.    INTERVAL HISTORY:  [name] is here for routine follow up prior to her next Prolia.  However she declines to continue this for now.  She has had 2 doses and complains of right jaw pain and cramping.  She continues anastrozole daily without significant difficulty.  She had her annual left mammogram on 7/8 and this was clear. She states that she has been fairly well now but experienced an episode of syncope and was evaluated with finding of hyponatremia.  The brain CT showed only mild atrophy.  Blood counts and chemistries are unremarkable except for a sodium of 133, stable.  Her  appetite is good, and she has lost 1 1/2 pounds since her last visit.  She denies fever, chills or other  signs of infection.  She denies nausea, vomiting, bowel issues, or abdominal pain.  She denies sore throat, cough, dyspnea, or chest pain.  REVIEW OF SYSTEMS:  Review of Systems  Constitutional: Negative.   HENT:  Negative.    Eyes: Negative.   Respiratory: Negative.    Cardiovascular: Negative.   Gastrointestinal: Negative.   Endocrine: Negative.   Genitourinary: Negative.    Musculoskeletal:  Positive for arthralgias (especially of the left knee).  Skin: Negative.   Neurological: Negative.   Hematological: Negative.   Psychiatric/Behavioral: Negative.      VITALS:  There were no vitals taken for this visit.  Wt Readings from Last 3 Encounters:  08/16/20 146 lb 8 oz (66.5 kg)  04/22/20 148 lb 6 oz (67.3 kg)  02/13/20 149 lb 7 oz (67.8 kg)    There is no height or weight on file to calculate BMI.  Performance status (ECOG): 1 - Symptomatic but completely ambulatory  PHYSICAL EXAM:  Physical Exam Constitutional:      General: She is not in acute distress.    Appearance: Normal appearance. She is normal weight.  HENT:     Head: Normocephalic and atraumatic.  Eyes:     General: No scleral icterus.    Extraocular Movements: Extraocular movements intact.     Conjunctiva/sclera: Conjunctivae normal.     Pupils: Pupils are equal, round, and reactive to light.  Cardiovascular:     Rate and Rhythm: Normal rate and regular rhythm.     Pulses: Normal pulses.     Heart sounds: Normal heart sounds. No murmur heard.    No friction rub. No gallop.  Pulmonary:     Effort: Pulmonary effort is normal. No respiratory distress.     Breath sounds: Normal breath sounds.  Chest:  Breasts:    Right: Absent.     Left: Normal.     Comments: Right mastectomy is negative.  She has a stable right axillary lipoma.  Left breast has a well healed scar in the upper inner quadrant, but no masses.   Abdominal:     General: Bowel sounds are normal. There is no distension.     Palpations: Abdomen  is soft. There is no mass.     Tenderness: There is no abdominal tenderness.  Musculoskeletal:        General: Normal range of motion.     Cervical back: Normal range of motion and neck supple.     Right lower leg: No edema.     Left lower leg: No edema.     Comments: She has crepitation of the left knee  Lymphadenopathy:     Cervical: No cervical adenopathy.  Skin:    General: Skin is warm and dry.  Neurological:     General: No focal deficit present.     Mental Status: She is alert and oriented to person, place, and time. Mental status is at baseline.  Psychiatric:        Mood  and Affect: Mood normal.        Behavior: Behavior normal.        Thought Content: Thought content normal.        Judgment: Judgment normal.    LABS:      Latest Ref Rng & Units 08/16/2020   12:00 AM 02/11/2020   12:00 AM  CBC  WBC  8.0     7.1      Hemoglobin 12.0 - 16.0 12.6     12.9      Hematocrit 36 - 46 37     38      Platelets 150 - 399 348     382         This result is from an external source.       Latest Ref Rng & Units 08/16/2020   12:00 AM 04/29/2020   12:00 PM 02/11/2020   12:00 AM  CMP  BUN 4 - '21 15      17      '$ Creatinine 0.5 - 1.1 0.9     0.90  1.2      Sodium 137 - 147 133      133      Potassium 3.4 - 5.3 4.4      4.1      Chloride 99 - 108 101      98      CO2 13 - '22 23      26      '$ Calcium 8.7 - 10.7 8.6      9.4      Alkaline Phos 25 - 125 54      55      AST 13 - 35 28      28      ALT 7 - 35 17      18         This result is from an external source.     STUDIES:  Annual left mammogram was clear 08/13/20.  Allergies:  Allergies  Allergen Reactions  . Cefdinir Nausea Only  . Hydrocodone-Acetaminophen Other (See Comments)  . Latuda [Lurasidone]   . Prednisone   . Codeine Other (See Comments) and Rash  . Penicillins Rash and Swelling  . Sulfa Antibiotics Rash    Current Medications: Current Outpatient Medications  Medication Sig Dispense Refill  .  ALPRAZolam (XANAX) 1 MG tablet Take 0.5-1 mg by mouth daily as needed.    Marland Kitchen amLODipine (NORVASC) 5 MG tablet Take 5 mg by mouth daily.    Marland Kitchen anastrozole (ARIMIDEX) 1 MG tablet TAKE ONE TABLET BY MOUTH EVERY DAY 90 tablet 3  . aspirin EC 81 MG tablet Take 81 mg by mouth daily.    Marland Kitchen atorvastatin (LIPITOR) 40 MG tablet Take 40 mg by mouth daily.    . brimonidine-timolol (COMBIGAN) 0.2-0.5 % ophthalmic solution SMARTSIG:1 Drop(s) In Eye(s) Every 12 Hours    . busPIRone (BUSPAR) 5 MG tablet Take 1 pill every 6-8 hours up to three times daily as needed for anxiety    . calcium-vitamin D (OSCAL WITH D) 250-125 MG-UNIT tablet Take 2 tablets by mouth 2 (two) times daily. Per Dr. Hinton Rao patient is to take 2 tablet twice a day    . carteolol (OCUPRESS) 1 % ophthalmic solution Place 1 drop into the left eye 2 (two) times daily.    Marland Kitchen co-enzyme Q-10 30 MG capsule Take 30 mg by mouth daily.    Marland Kitchen denosumab (PROLIA) 60 MG/ML SOSY  injection Inject 60 mg into the skin every 6 (six) months.    Mariane Baumgarten Sodium (DSS) 100 MG CAPS Take by mouth.    . dorzolamide-timolol (COSOPT) 22.3-6.8 MG/ML ophthalmic solution Place 1 drop into the right eye 2 (two) times daily.    Marland Kitchen gabapentin (NEURONTIN) 100 MG capsule Take 100 mg by mouth as needed for pain.    . isosorbide mononitrate (IMDUR) 30 MG 24 hr tablet Take 30 mg by mouth daily.    Marland Kitchen levothyroxine (SYNTHROID) 75 MCG tablet Take 75 mcg by mouth daily before breakfast.    . lisinopril (ZESTRIL) 10 MG tablet Take 10 mg by mouth daily.    Marland Kitchen loratadine (CLARITIN) 10 MG tablet Take 20 mg by mouth daily with breakfast.    . MAGNESIUM PO Take 2 tablets by mouth daily.    . melatonin 1 MG TABS tablet Take by mouth.    . metoprolol succinate (TOPROL-XL) 50 MG 24 hr tablet Take 50 mg by mouth daily.    . mirtazapine (REMERON) 15 MG tablet Take 15 mg by mouth at bedtime.    . Multiple Minerals-Vitamins (CALCIUM & VIT D3 BONE HEALTH PO) Take 2 tablets by mouth daily. Chewables     . mupirocin ointment (BACTROBAN) 2 % Place 1 application into the nose 2 (two) times daily.    . nitroGLYCERIN (NITROSTAT) 0.4 MG SL tablet Place 0.4 mg under the tongue as needed for chest pain.    . pantoprazole (PROTONIX) 40 MG tablet Take 1 tablet (40 mg total) by mouth 2 (two) times daily. 60 tablet 3  . potassium chloride SA (KLOR-CON) 20 MEQ tablet Take 20 mEq by mouth daily.    . psyllium (KONSYL) 33 % POWD Take by mouth.    . sertraline (ZOLOFT) 25 MG tablet Take 25 mg by mouth daily.    . sucralfate (CARAFATE) 1 g tablet Take 1 g by mouth 2 (two) times daily.    Marland Kitchen triamcinolone (KENALOG) 0.1 % Apply 1 application topically 2 (two) times daily as needed for rash.     No current facility-administered medications for this visit.     ASSESSMENT & PLAN:   Assessment:   1. History of stage IA hormone receptor positive left breast cancer December 2011 treated with lumpectomy and postoperative radiation, but she was unable to tolerate hormonal therapy.    2. 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  She has been on and off adjuvant hormonal therapy, due to difficulty tolerating it.  She has resumed anastrozole for the last 6 months without difficulty.  3. Osteoporosis, for which she was started on Prolia every 6 months in July 2021.  She has received 2 doses but wishes to stop it for now.  Her last DEXA had shown progression from osteopenia to osteoporosis, but not severe, with a T score of 2.7.  We will hold this until her next bone density scan, which would be due in 1 year.  I advised her to increase her calcium supplement to 2 pills twice daily, i.e. double the dose, since her calcium is only 8.6.  4. Small soft right axillary lipoma.  5. Nodules of the right mastectomy incision.  Biopsies revealed fat necrosis. She just has a mild firmness there now.  6. Chronic hyponatremia with apparent worsening  this spring, very symptomatic.      Plan: We will cancel her Prolia planned for this week.  She knows  to continue anastrozole daily, and is tolerating this without difficulty at this time.  We will see her back in 6 months with a CBC, comprehensive metabolic panel and TSH for repeat examination.  We will repeat her left mammogram and DEXA in 1 year. The patient understands the plans discussed today and is in agreement with them.  She knows to contact our office if she develops concerns regarding her breast cancer or its treatment.   I provided 20 minutes of face-to-face time during this this encounter and > 50% was spent counseling as documented under my assessment and plan.    Derwood Kaplan, MD [facility] AT [facility] [address] [place] [address] Dept: [phone] Dept Fax: [phone]

## 2021-09-23 ENCOUNTER — Ambulatory Visit: Payer: Medicare Other | Admitting: Oncology

## 2021-09-23 ENCOUNTER — Inpatient Hospital Stay: Payer: Medicare Other | Attending: Oncology

## 2021-09-23 ENCOUNTER — Other Ambulatory Visit: Payer: Self-pay | Admitting: Oncology

## 2021-09-23 ENCOUNTER — Inpatient Hospital Stay: Payer: Medicare Other

## 2021-09-23 ENCOUNTER — Inpatient Hospital Stay: Payer: Medicare Other | Admitting: Oncology

## 2021-09-23 VITALS — BP 164/73 | HR 86 | Temp 97.6°F | Resp 16 | Ht 62.75 in | Wt 148.0 lb

## 2021-09-23 DIAGNOSIS — C50212 Malignant neoplasm of upper-inner quadrant of left female breast: Secondary | ICD-10-CM

## 2021-09-23 DIAGNOSIS — Z170 Estrogen receptor positive status [ER+]: Secondary | ICD-10-CM

## 2021-09-23 DIAGNOSIS — M810 Age-related osteoporosis without current pathological fracture: Secondary | ICD-10-CM

## 2021-09-23 DIAGNOSIS — D649 Anemia, unspecified: Secondary | ICD-10-CM | POA: Diagnosis not present

## 2021-09-23 DIAGNOSIS — C50411 Malignant neoplasm of upper-outer quadrant of right female breast: Secondary | ICD-10-CM

## 2021-09-23 DIAGNOSIS — C50211 Malignant neoplasm of upper-inner quadrant of right female breast: Secondary | ICD-10-CM | POA: Diagnosis not present

## 2021-09-23 LAB — BASIC METABOLIC PANEL
BUN: 18 (ref 4–21)
CO2: 29 — AB (ref 13–22)
Chloride: 104 (ref 99–108)
Creatinine: 0.8 (ref 0.5–1.1)
Glucose: 108
Potassium: 3.8 mEq/L (ref 3.5–5.1)
Sodium: 139 (ref 137–147)

## 2021-09-23 LAB — HEPATIC FUNCTION PANEL
ALT: 21 U/L (ref 7–35)
AST: 25 (ref 13–35)
Alkaline Phosphatase: 116 (ref 25–125)
Bilirubin, Total: 0.6

## 2021-09-23 LAB — CBC AND DIFFERENTIAL
HCT: 40 (ref 36–46)
Hemoglobin: 13.5 (ref 12.0–16.0)
Neutrophils Absolute: 6.57
Platelets: 416 10*3/uL — AB (ref 150–400)
WBC: 9.8

## 2021-09-23 LAB — COMPREHENSIVE METABOLIC PANEL
Albumin: 4.3 (ref 3.5–5.0)
Calcium: 9.3 (ref 8.7–10.7)

## 2021-09-23 LAB — CBC: RBC: 4.43 (ref 3.87–5.11)

## 2021-09-23 NOTE — Progress Notes (Signed)
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  She was treated with lumpectomy.  Pathology revealed a 0.5 cm invasive ductal carcinoma in the background of high-grade ductal carcinoma in situ with a negative sentinel node.  Estrogen and progesterone receptors were positive and HER 2 negative.  Adjuvant chemotherapy was not recommended.  She did received postoperative radiation to the left breast and then was placed on anastrozole 1 mg daily in May 2012. She did not tolerate anastrozole due to malaise, so was switched to letrozole and did not tolerate that either.  Eventually she refused all hormonal therapy.  She underwent cholecystectomy in December 2016 and was in the hospital for 7 days.    We scheduled her for mammogram in February 2019, which revealed 2 suspicious masses in the right breast and biopsy was recommended.  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  The imaging appeared to be a 1.5 cm lesion.  Estrogen and progesterone receptors were highly positive with HER 2 negative, and a Ki 67 of 25%.  A second biopsy at 1 o'clock was benign, but felt to be discordant.  We had been concerned about her memory.  She underwent right mastectomy in April 2019.  Pathology revealed a multicentric invasive ductal carcinoma with 5 negative nodes.  One lesion was grade 3 and 15 mm in size, and the other was grade 1 and 13 mm lesion in size, for a stage IA (T1c N0 M0) hormone  receptor positive right breast cancer.  Bone density scan in June 2019 revealed worsening osteopenia of the femur for a T-score of -2.4 and stable osteopenia of the spine with a T-score of -1.9.  She was placed on adjuvant hormonal therapy with letrozole 2.5 mg in June 2019. She initially tolerated this fairly well, but later stopped it on her own.  In August 2019, she develops to small nodules of the right mastectomy site.  Biopsy of both of these in September 2019 revealed fat necrosis.  Bone density scan from June revealed osteoporosis with a T-score of -2.7 of the right femur, previously -2.2, and a T-score of-2.4 in the spine, previously -1.9.  Left screening mammogram in July did not reveal any evidence of malignancy. Due to the osteoporosis, we recommended that she be placed on Prolia every 6 months and she received her 1st dose on July 19th.  It was stopped after 2 doses due to right jaw pain and cramping.  She has been on and off hormonal therapy with letrozole and anastrozole, but currently is compliant.    INTERVAL HISTORY:  Holly Welch is here for routine follow up.  She had 2 doses of Prolia and complained of right jaw pain and cramping, so it was stopped.  She continues anastrozole daily without significant difficulty.  She had her last left mammogram on 08/13/20 and this was clear. She she had a stroke in August 2022 and again in May 2023.  She was on Plavix for 3 months  and developed bleeding of the right eye.  She still has some residual weakness of the leg left side and some mild tremor.  Blood counts and chemistries are unremarkable except for a mild thrombocytosis of 416,000.  Her  appetite is good, and she has gained 1 1/2 pounds since her last visit.  She denies fever, chills or other signs of infection.  She denies nausea, vomiting, bowel issues, or abdominal pain.  She denies sore throat, cough, dyspnea, or chest pain.  REVIEW OF SYSTEMS:  Review of Systems  Constitutional: Negative.   HENT:   Negative.    Eyes: Negative.   Respiratory: Negative.    Cardiovascular: Negative.   Gastrointestinal: Negative.   Endocrine: Negative.   Genitourinary: Negative.    Musculoskeletal:  Positive for arthralgias (especially of the left knee).  Skin: Negative.   Neurological: Negative.   Hematological: Negative.   Psychiatric/Behavioral: Negative.       VITALS:  Blood pressure (!) 164/73, pulse 86, temperature 97.6 F (36.4 C), temperature source Oral, resp. rate 16, height 5' 2.75" (1.594 m), weight 148 lb (67.1 kg), SpO2 95 %.  Wt Readings from Last 3 Encounters:  09/23/21 148 lb (67.1 kg)  08/16/20 146 lb 8 oz (66.5 kg)  04/22/20 148 lb 6 oz (67.3 kg)    Body mass index is 26.43 kg/m.  Performance status (ECOG): 1 - Symptomatic but completely ambulatory  PHYSICAL EXAM:  Physical Exam Constitutional:      General: She is not in acute distress.    Appearance: Normal appearance. She is normal weight.  HENT:     Head: Normocephalic and atraumatic.  Eyes:     General: No scleral icterus.    Extraocular Movements: Extraocular movements intact.     Conjunctiva/sclera: Conjunctivae normal.     Pupils: Pupils are equal, round, and reactive to light.  Cardiovascular:     Rate and Rhythm: Normal rate and regular rhythm.     Pulses: Normal pulses.     Heart sounds: Normal heart sounds. No murmur heard.    No friction rub. No gallop.  Pulmonary:     Effort: Pulmonary effort is normal. No respiratory distress.     Breath sounds: Normal breath sounds.  Chest:  Breasts:    Right: Absent.     Left: Normal.     Comments: Right mastectomy is negative.  She has a stable right axillary lipoma.  Left breast has a well healed scar in the upper inner quadrant, but no masses.   Abdominal:     General: Bowel sounds are normal. There is no distension.     Palpations: Abdomen is soft. There is no mass.     Tenderness: There is no abdominal tenderness.  Musculoskeletal:        General:  Normal range of motion.     Cervical back: Normal range of motion and neck supple.     Right lower leg: No edema.     Left lower leg: No edema.     Comments: She has crepitation of the left knee  Lymphadenopathy:     Cervical: No cervical adenopathy.  Skin:    General: Skin is warm and dry.  Neurological:     General: No focal deficit present.     Mental Status: She is alert and oriented to person, place, and time. Mental status is at baseline.  Psychiatric:        Mood and Affect: Mood normal.  Behavior: Behavior normal.        Thought Content: Thought content normal.        Judgment: Judgment normal.     LABS:      Latest Ref Rng & Units 09/23/2021   12:00 AM 08/16/2020   12:00 AM 02/11/2020   12:00 AM  CBC  WBC  9.8     8.0     7.1      Hemoglobin 12.0 - 16.0 13.5     12.6     12.9      Hematocrit 36 - 46 40     37     38      Platelets 150 - 400 K/uL 416     348     382         This result is from an external source.      Latest Ref Rng & Units 09/23/2021   12:00 AM 08/16/2020   12:00 AM 04/29/2020   12:00 PM  CMP  BUN 4 - '21 18     15       '$ Creatinine 0.5 - 1.1 0.8     0.9     0.90   Sodium 137 - 147 139     133       Potassium 3.5 - 5.1 mEq/L 3.8     4.4       Chloride 99 - 108 104     101       CO2 13 - '22 29     23       '$ Calcium 8.7 - 10.7 9.3     8.6       Alkaline Phos 25 - 125 116     54       AST 13 - 35 25     28       ALT 7 - 35 U/L 21     17          This result is from an external source.    STUDIES:  Annual left mammogram was clear 08/13/20.  Allergies:  Allergies  Allergen Reactions   Cefdinir Nausea Only   Hydrocodone-Acetaminophen Other (See Comments)   Latuda [Lurasidone]    Prednisone    Codeine Other (See Comments) and Rash   Penicillins Rash and Swelling   Sulfa Antibiotics Rash    Current Medications: Current Outpatient Medications  Medication Sig Dispense Refill   anastrozole (ARIMIDEX) 1 MG tablet Take 1 tablet by mouth  daily.     ALPRAZolam (XANAX) 1 MG tablet Take 0.5-1 mg by mouth daily as needed.     amLODipine (NORVASC) 5 MG tablet Take 5 mg by mouth daily.     atorvastatin (LIPITOR) 40 MG tablet Take 40 mg by mouth daily.     carteolol (OCUPRESS) 1 % ophthalmic solution Place 1 drop into the left eye 2 (two) times daily.     cyanocobalamin (VITAMIN B12) 1000 MCG tablet Take 1 tablet by mouth daily.     denosumab (PROLIA) 60 MG/ML SOSY injection Inject 60 mg into the skin every 6 (six) months.     famotidine (PEPCID) 40 MG tablet Take 40 mg by mouth 2 (two) times daily as needed.     ibuprofen (ADVIL) 600 MG tablet Take by mouth.     levothyroxine (SYNTHROID) 75 MCG tablet Take 75 mcg by mouth daily before breakfast.     MAGNESIUM PO Take 2 tablets by mouth daily.  metoprolol succinate (TOPROL-XL) 50 MG 24 hr tablet Take 50 mg by mouth daily.     mirtazapine (REMERON) 15 MG tablet Take 15 mg by mouth at bedtime.     nitroGLYCERIN (NITROSTAT) 0.4 MG SL tablet Place 0.4 mg under the tongue as needed for chest pain.     sucralfate (CARAFATE) 1 g tablet Take 1 g by mouth 2 (two) times daily.     terbinafine (LAMISIL) 250 MG tablet Take 250 mg by mouth daily.     venlafaxine (EFFEXOR) 75 MG tablet Take 75 mg by mouth daily.     No current facility-administered medications for this visit.     ASSESSMENT & PLAN:   Assessment:   1. History of stage IA hormone receptor positive left breast cancer December 2011 treated with lumpectomy and postoperative radiation, but she was unable to tolerate hormonal therapy.    2. 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  She has been on and off adjuvant hormonal therapy, due to difficulty tolerating it.  She has resumed anastrozole without difficulty.  Her last mammogram was in July 2022.  3. Osteoporosis, for which she was started on Prolia every 6 months in July 2021.  She has received  2 doses but it was stopped due to toxicities.  Her last DEXA had shown progression from osteopenia to osteoporosis, but not severe, with a T score of 2.7.  She is due for her next bone density scan as the last one was in June 2021.  I advised her to increase her calcium supplement to 2 pills twice daily, i.e. double the dose, and her calcium improved to 9.3.  4.  Nodules of the right mastectomy incision.  Biopsies revealed fat necrosis. She just has a mild firmness there now.   Plan: We have stopped her Prolia but I would like to recheck a bone density scan since it has been over 2 years.  She knows to continue anastrozole daily, and is tolerating this without difficulty at this time.  She as completed 4 years and has 1 more year to go.  We will see her back in 1 year with a CBC, and comprehensive metabolic panel for repeat examination.  She will then be due to stop her anastrozole.  We will schedule her left mammogram and DEXA now.  The patient understands the plans discussed today and is in agreement with them.  She knows to contact our office if she develops concerns regarding her breast cancer or its treatment.   I provided 20 minutes of face-to-face time during this this encounter and > 50% was spent counseling as documented under my assessment and plan.    Derwood Kaplan, MD [facility] AT [facility] [address] [place] [address] Dept: [phone] Dept Fax: [phone]

## 2021-10-03 DIAGNOSIS — I69349 Monoplegia of lower limb following cerebral infarction affecting unspecified side: Secondary | ICD-10-CM | POA: Diagnosis not present

## 2021-10-03 DIAGNOSIS — I69354 Hemiplegia and hemiparesis following cerebral infarction affecting left non-dominant side: Secondary | ICD-10-CM | POA: Diagnosis not present

## 2021-10-03 DIAGNOSIS — Z79899 Other long term (current) drug therapy: Secondary | ICD-10-CM | POA: Diagnosis not present

## 2021-10-03 DIAGNOSIS — E039 Hypothyroidism, unspecified: Secondary | ICD-10-CM | POA: Diagnosis not present

## 2021-10-03 DIAGNOSIS — M1991 Primary osteoarthritis, unspecified site: Secondary | ICD-10-CM | POA: Diagnosis not present

## 2021-10-03 DIAGNOSIS — I6389 Other cerebral infarction: Secondary | ICD-10-CM | POA: Diagnosis not present

## 2021-10-03 DIAGNOSIS — M4727 Other spondylosis with radiculopathy, lumbosacral region: Secondary | ICD-10-CM | POA: Diagnosis not present

## 2021-10-03 DIAGNOSIS — G720 Drug-induced myopathy: Secondary | ICD-10-CM | POA: Diagnosis not present

## 2021-10-03 DIAGNOSIS — I70213 Atherosclerosis of native arteries of extremities with intermittent claudication, bilateral legs: Secondary | ICD-10-CM | POA: Diagnosis not present

## 2021-10-03 DIAGNOSIS — I10 Essential (primary) hypertension: Secondary | ICD-10-CM | POA: Diagnosis not present

## 2021-10-03 DIAGNOSIS — E782 Mixed hyperlipidemia: Secondary | ICD-10-CM | POA: Diagnosis not present

## 2021-10-03 DIAGNOSIS — I672 Cerebral atherosclerosis: Secondary | ICD-10-CM | POA: Diagnosis not present

## 2021-10-03 DIAGNOSIS — I201 Angina pectoris with documented spasm: Secondary | ICD-10-CM | POA: Diagnosis not present

## 2021-10-04 DIAGNOSIS — I493 Ventricular premature depolarization: Secondary | ICD-10-CM | POA: Diagnosis not present

## 2021-10-04 DIAGNOSIS — I471 Supraventricular tachycardia: Secondary | ICD-10-CM | POA: Diagnosis not present

## 2021-10-04 DIAGNOSIS — I491 Atrial premature depolarization: Secondary | ICD-10-CM | POA: Diagnosis not present

## 2021-10-13 DIAGNOSIS — H40052 Ocular hypertension, left eye: Secondary | ICD-10-CM | POA: Diagnosis not present

## 2021-10-17 DIAGNOSIS — I639 Cerebral infarction, unspecified: Secondary | ICD-10-CM | POA: Diagnosis not present

## 2021-10-21 DIAGNOSIS — M542 Cervicalgia: Secondary | ICD-10-CM | POA: Diagnosis not present

## 2021-10-21 DIAGNOSIS — M1991 Primary osteoarthritis, unspecified site: Secondary | ICD-10-CM | POA: Diagnosis not present

## 2021-11-03 DIAGNOSIS — I447 Left bundle-branch block, unspecified: Secondary | ICD-10-CM | POA: Diagnosis not present

## 2021-11-03 DIAGNOSIS — I5020 Unspecified systolic (congestive) heart failure: Secondary | ICD-10-CM | POA: Diagnosis not present

## 2021-11-03 DIAGNOSIS — I340 Nonrheumatic mitral (valve) insufficiency: Secondary | ICD-10-CM | POA: Diagnosis not present

## 2021-11-03 DIAGNOSIS — I110 Hypertensive heart disease with heart failure: Secondary | ICD-10-CM | POA: Diagnosis not present

## 2021-11-08 DIAGNOSIS — C50212 Malignant neoplasm of upper-inner quadrant of left female breast: Secondary | ICD-10-CM | POA: Diagnosis not present

## 2021-11-15 DIAGNOSIS — H40052 Ocular hypertension, left eye: Secondary | ICD-10-CM | POA: Diagnosis not present

## 2021-11-25 ENCOUNTER — Encounter: Payer: Self-pay | Admitting: Oncology

## 2021-11-25 DIAGNOSIS — M810 Age-related osteoporosis without current pathological fracture: Secondary | ICD-10-CM | POA: Diagnosis not present

## 2021-11-25 DIAGNOSIS — Z1231 Encounter for screening mammogram for malignant neoplasm of breast: Secondary | ICD-10-CM | POA: Diagnosis not present

## 2021-12-01 DIAGNOSIS — Z23 Encounter for immunization: Secondary | ICD-10-CM | POA: Diagnosis not present

## 2021-12-02 DIAGNOSIS — E039 Hypothyroidism, unspecified: Secondary | ICD-10-CM | POA: Diagnosis not present

## 2021-12-02 DIAGNOSIS — J302 Other seasonal allergic rhinitis: Secondary | ICD-10-CM | POA: Diagnosis not present

## 2021-12-13 DIAGNOSIS — H40052 Ocular hypertension, left eye: Secondary | ICD-10-CM | POA: Diagnosis not present

## 2021-12-15 ENCOUNTER — Telehealth: Payer: Self-pay

## 2021-12-15 NOTE — Telephone Encounter (Addendum)
Pt notified of below and verbalized understanding. I will update med list.   Message Received: Today Holly Kaplan, MD  Dairl Ponder, RN; Phy, Beverly Gust, RPH Caller: Unspecified [name],  3:48 PM) I see it in EPIC. 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 I think she needs to stop the anastrazole, has been theoretically 4 1/2 years but with variable compliance. She did not tolerate Prolia.   12/15/2021 - Pt req results of bone density. Message sent to Dr Hinton Rao @ [phone]-[facility].

## 2021-12-16 DIAGNOSIS — I340 Nonrheumatic mitral (valve) insufficiency: Secondary | ICD-10-CM | POA: Diagnosis not present

## 2021-12-16 DIAGNOSIS — I088 Other rheumatic multiple valve diseases: Secondary | ICD-10-CM | POA: Diagnosis not present

## 2021-12-16 DIAGNOSIS — I5020 Unspecified systolic (congestive) heart failure: Secondary | ICD-10-CM | POA: Diagnosis not present

## 2021-12-16 DIAGNOSIS — I447 Left bundle-branch block, unspecified: Secondary | ICD-10-CM | POA: Diagnosis not present

## 2021-12-26 DIAGNOSIS — I672 Cerebral atherosclerosis: Secondary | ICD-10-CM | POA: Diagnosis not present

## 2021-12-26 DIAGNOSIS — E039 Hypothyroidism, unspecified: Secondary | ICD-10-CM | POA: Diagnosis not present

## 2021-12-26 DIAGNOSIS — R7302 Impaired glucose tolerance (oral): Secondary | ICD-10-CM | POA: Diagnosis not present

## 2021-12-26 DIAGNOSIS — Z79899 Other long term (current) drug therapy: Secondary | ICD-10-CM | POA: Diagnosis not present

## 2021-12-26 DIAGNOSIS — G720 Drug-induced myopathy: Secondary | ICD-10-CM | POA: Diagnosis not present

## 2021-12-26 DIAGNOSIS — E782 Mixed hyperlipidemia: Secondary | ICD-10-CM | POA: Diagnosis not present

## 2021-12-26 DIAGNOSIS — Z0000 Encounter for general adult medical examination without abnormal findings: Secondary | ICD-10-CM | POA: Diagnosis not present

## 2021-12-26 DIAGNOSIS — I10 Essential (primary) hypertension: Secondary | ICD-10-CM | POA: Diagnosis not present

## 2021-12-27 DIAGNOSIS — C50212 Malignant neoplasm of upper-inner quadrant of left female breast: Secondary | ICD-10-CM | POA: Diagnosis not present

## 2022-01-09 DIAGNOSIS — H33001 Unspecified retinal detachment with retinal break, right eye: Secondary | ICD-10-CM | POA: Diagnosis not present

## 2022-01-12 ENCOUNTER — Telehealth: Payer: Self-pay

## 2022-01-12 NOTE — Patient Outreach (Signed)
  Care Coordination   01/12/2022 Name: Holly Welch MRN: [id number] DOB: 11/25/1940   Care Coordination Outreach Attempts:  An unsuccessful telephone outreach was attempted today to offer the patient information about available care coordination services as a benefit of their health plan.   Follow Up Plan:  Additional outreach attempts will be made to offer the patient care coordination information and services.   Encounter Outcome:  No Answer   Care Coordination Interventions:  No, not indicated    Tomasa Rand, RN, BSN, [facility] [employer] [phone]

## 2022-01-13 ENCOUNTER — Telehealth: Payer: Self-pay

## 2022-01-13 NOTE — Patient Outreach (Signed)
  Care Coordination   Initial Visit Note   01/13/2022 Name: Holly Welch MRN: [id number] DOB: May 06, 1940  Michael Ventresca is a 81 y.o. year old female who sees Street, Sharon Mt, MD for primary care. I spoke with  Koren Bound by phone today.  What matters to the patients health and wellness today?  Placed call to patient to review and offer [facility] care coordination program.  Patient reports she is unsure if she is interested.  I offered to mail out a [facility] brochure and she declined. Provided my contact information and encouraged patient to call me back if she decides. She agreed.    SDOH assessments and interventions completed:  No     Care Coordination Interventions:  No, not indicated   Follow up plan: No further intervention required.   Encounter Outcome:  Pt. Refused   .acs

## 2022-01-24 DIAGNOSIS — H40052 Ocular hypertension, left eye: Secondary | ICD-10-CM | POA: Diagnosis not present

## 2022-02-02 DIAGNOSIS — J329 Chronic sinusitis, unspecified: Secondary | ICD-10-CM | POA: Diagnosis not present

## 2022-02-02 DIAGNOSIS — J40 Bronchitis, not specified as acute or chronic: Secondary | ICD-10-CM | POA: Diagnosis not present

## 2022-02-02 DIAGNOSIS — G4700 Insomnia, unspecified: Secondary | ICD-10-CM | POA: Diagnosis not present

## 2022-03-30 DIAGNOSIS — C44622 Squamous cell carcinoma of skin of right upper limb, including shoulder: Secondary | ICD-10-CM | POA: Diagnosis not present

## 2022-03-30 DIAGNOSIS — L570 Actinic keratosis: Secondary | ICD-10-CM | POA: Diagnosis not present

## 2022-03-30 DIAGNOSIS — L578 Other skin changes due to chronic exposure to nonionizing radiation: Secondary | ICD-10-CM | POA: Diagnosis not present

## 2022-03-30 DIAGNOSIS — L821 Other seborrheic keratosis: Secondary | ICD-10-CM | POA: Diagnosis not present

## 2022-04-03 DIAGNOSIS — I69354 Hemiplegia and hemiparesis following cerebral infarction affecting left non-dominant side: Secondary | ICD-10-CM | POA: Diagnosis not present

## 2022-04-03 DIAGNOSIS — I639 Cerebral infarction, unspecified: Secondary | ICD-10-CM | POA: Diagnosis not present

## 2022-04-10 DIAGNOSIS — I5020 Unspecified systolic (congestive) heart failure: Secondary | ICD-10-CM | POA: Diagnosis not present

## 2022-04-10 DIAGNOSIS — E785 Hyperlipidemia, unspecified: Secondary | ICD-10-CM | POA: Diagnosis not present

## 2022-04-10 DIAGNOSIS — I110 Hypertensive heart disease with heart failure: Secondary | ICD-10-CM | POA: Diagnosis not present

## 2022-04-10 DIAGNOSIS — I619 Nontraumatic intracerebral hemorrhage, unspecified: Secondary | ICD-10-CM | POA: Diagnosis not present

## 2022-04-10 DIAGNOSIS — I447 Left bundle-branch block, unspecified: Secondary | ICD-10-CM | POA: Diagnosis not present

## 2022-04-10 DIAGNOSIS — I340 Nonrheumatic mitral (valve) insufficiency: Secondary | ICD-10-CM | POA: Diagnosis not present

## 2022-04-17 DIAGNOSIS — D0461 Carcinoma in situ of skin of right upper limb, including shoulder: Secondary | ICD-10-CM | POA: Diagnosis not present

## 2022-05-16 DIAGNOSIS — J40 Bronchitis, not specified as acute or chronic: Secondary | ICD-10-CM | POA: Diagnosis not present

## 2022-05-16 DIAGNOSIS — J329 Chronic sinusitis, unspecified: Secondary | ICD-10-CM | POA: Diagnosis not present

## 2022-05-26 NOTE — Telephone Encounter (Signed)
Done

## 2022-05-30 DIAGNOSIS — H40052 Ocular hypertension, left eye: Secondary | ICD-10-CM | POA: Diagnosis not present

## 2022-06-07 DIAGNOSIS — H01009 Unspecified blepharitis unspecified eye, unspecified eyelid: Secondary | ICD-10-CM | POA: Diagnosis not present

## 2022-06-07 DIAGNOSIS — H109 Unspecified conjunctivitis: Secondary | ICD-10-CM | POA: Diagnosis not present

## 2022-06-08 DIAGNOSIS — J329 Chronic sinusitis, unspecified: Secondary | ICD-10-CM | POA: Diagnosis not present

## 2022-06-08 DIAGNOSIS — J40 Bronchitis, not specified as acute or chronic: Secondary | ICD-10-CM | POA: Diagnosis not present

## 2022-06-08 DIAGNOSIS — K581 Irritable bowel syndrome with constipation: Secondary | ICD-10-CM | POA: Diagnosis not present

## 2022-06-08 DIAGNOSIS — I69349 Monoplegia of lower limb following cerebral infarction affecting unspecified side: Secondary | ICD-10-CM | POA: Diagnosis not present

## 2022-06-08 DIAGNOSIS — C50911 Malignant neoplasm of unspecified site of right female breast: Secondary | ICD-10-CM | POA: Diagnosis not present

## 2022-06-08 DIAGNOSIS — I672 Cerebral atherosclerosis: Secondary | ICD-10-CM | POA: Diagnosis not present

## 2022-06-08 DIAGNOSIS — J301 Allergic rhinitis due to pollen: Secondary | ICD-10-CM | POA: Diagnosis not present

## 2022-06-08 DIAGNOSIS — I700 Atherosclerosis of aorta: Secondary | ICD-10-CM | POA: Diagnosis not present

## 2022-06-08 DIAGNOSIS — I69354 Hemiplegia and hemiparesis following cerebral infarction affecting left non-dominant side: Secondary | ICD-10-CM | POA: Diagnosis not present

## 2022-06-08 DIAGNOSIS — I70213 Atherosclerosis of native arteries of extremities with intermittent claudication, bilateral legs: Secondary | ICD-10-CM | POA: Diagnosis not present

## 2022-06-08 DIAGNOSIS — I201 Angina pectoris with documented spasm: Secondary | ICD-10-CM | POA: Diagnosis not present

## 2022-07-06 DIAGNOSIS — R7302 Impaired glucose tolerance (oral): Secondary | ICD-10-CM | POA: Diagnosis not present

## 2022-07-06 DIAGNOSIS — I10 Essential (primary) hypertension: Secondary | ICD-10-CM | POA: Diagnosis not present

## 2022-07-06 DIAGNOSIS — G720 Drug-induced myopathy: Secondary | ICD-10-CM | POA: Diagnosis not present

## 2022-07-06 DIAGNOSIS — J301 Allergic rhinitis due to pollen: Secondary | ICD-10-CM | POA: Diagnosis not present

## 2022-07-06 DIAGNOSIS — E871 Hypo-osmolality and hyponatremia: Secondary | ICD-10-CM | POA: Diagnosis not present

## 2022-07-06 DIAGNOSIS — E782 Mixed hyperlipidemia: Secondary | ICD-10-CM | POA: Diagnosis not present

## 2022-07-06 DIAGNOSIS — E890 Postprocedural hypothyroidism: Secondary | ICD-10-CM | POA: Diagnosis not present

## 2022-07-06 DIAGNOSIS — K581 Irritable bowel syndrome with constipation: Secondary | ICD-10-CM | POA: Diagnosis not present

## 2022-08-01 DIAGNOSIS — H40052 Ocular hypertension, left eye: Secondary | ICD-10-CM | POA: Diagnosis not present

## 2022-08-03 DIAGNOSIS — J320 Chronic maxillary sinusitis: Secondary | ICD-10-CM | POA: Diagnosis not present

## 2022-08-03 DIAGNOSIS — G44209 Tension-type headache, unspecified, not intractable: Secondary | ICD-10-CM | POA: Diagnosis not present

## 2022-08-03 DIAGNOSIS — I6509 Occlusion and stenosis of unspecified vertebral artery: Secondary | ICD-10-CM | POA: Diagnosis not present

## 2022-08-03 DIAGNOSIS — I672 Cerebral atherosclerosis: Secondary | ICD-10-CM | POA: Diagnosis not present

## 2022-08-03 DIAGNOSIS — I639 Cerebral infarction, unspecified: Secondary | ICD-10-CM | POA: Diagnosis not present

## 2022-08-03 DIAGNOSIS — I6523 Occlusion and stenosis of bilateral carotid arteries: Secondary | ICD-10-CM | POA: Diagnosis not present

## 2022-08-03 DIAGNOSIS — I6521 Occlusion and stenosis of right carotid artery: Secondary | ICD-10-CM | POA: Diagnosis not present

## 2022-08-03 DIAGNOSIS — K5900 Constipation, unspecified: Secondary | ICD-10-CM | POA: Diagnosis not present

## 2022-08-03 DIAGNOSIS — I6782 Cerebral ischemia: Secondary | ICD-10-CM | POA: Diagnosis not present

## 2022-08-03 DIAGNOSIS — I6501 Occlusion and stenosis of right vertebral artery: Secondary | ICD-10-CM | POA: Diagnosis not present

## 2022-08-08 DIAGNOSIS — E890 Postprocedural hypothyroidism: Secondary | ICD-10-CM | POA: Diagnosis not present

## 2022-08-15 DIAGNOSIS — J329 Chronic sinusitis, unspecified: Secondary | ICD-10-CM | POA: Diagnosis not present

## 2022-08-15 DIAGNOSIS — J40 Bronchitis, not specified as acute or chronic: Secondary | ICD-10-CM | POA: Diagnosis not present

## 2022-09-06 DIAGNOSIS — Z23 Encounter for immunization: Secondary | ICD-10-CM | POA: Diagnosis not present

## 2022-09-06 DIAGNOSIS — W548XXA Other contact with dog, initial encounter: Secondary | ICD-10-CM | POA: Diagnosis not present

## 2022-09-06 DIAGNOSIS — S81811A Laceration without foreign body, right lower leg, initial encounter: Secondary | ICD-10-CM | POA: Diagnosis not present

## 2022-09-13 DIAGNOSIS — S81811A Laceration without foreign body, right lower leg, initial encounter: Secondary | ICD-10-CM | POA: Diagnosis not present

## 2022-09-18 DIAGNOSIS — S81811A Laceration without foreign body, right lower leg, initial encounter: Secondary | ICD-10-CM | POA: Diagnosis not present

## 2022-09-22 ENCOUNTER — Inpatient Hospital Stay: Payer: Medicare Other | Attending: Oncology | Admitting: Oncology

## 2022-09-22 ENCOUNTER — Inpatient Hospital Stay: Payer: Medicare Other

## 2022-09-22 DIAGNOSIS — S81811D Laceration without foreign body, right lower leg, subsequent encounter: Secondary | ICD-10-CM | POA: Diagnosis not present

## 2022-09-22 DIAGNOSIS — J301 Allergic rhinitis due to pollen: Secondary | ICD-10-CM | POA: Diagnosis not present

## 2022-11-06 DIAGNOSIS — I517 Cardiomegaly: Secondary | ICD-10-CM | POA: Diagnosis not present

## 2022-11-06 DIAGNOSIS — I5020 Unspecified systolic (congestive) heart failure: Secondary | ICD-10-CM | POA: Diagnosis not present

## 2022-11-06 DIAGNOSIS — I371 Nonrheumatic pulmonary valve insufficiency: Secondary | ICD-10-CM | POA: Diagnosis not present

## 2022-11-08 DIAGNOSIS — I110 Hypertensive heart disease with heart failure: Secondary | ICD-10-CM | POA: Diagnosis not present

## 2022-11-08 DIAGNOSIS — I088 Other rheumatic multiple valve diseases: Secondary | ICD-10-CM | POA: Diagnosis not present

## 2022-11-08 DIAGNOSIS — I447 Left bundle-branch block, unspecified: Secondary | ICD-10-CM | POA: Diagnosis not present

## 2022-11-08 DIAGNOSIS — E785 Hyperlipidemia, unspecified: Secondary | ICD-10-CM | POA: Diagnosis not present

## 2022-11-08 DIAGNOSIS — I5020 Unspecified systolic (congestive) heart failure: Secondary | ICD-10-CM | POA: Diagnosis not present

## 2022-11-14 ENCOUNTER — Other Ambulatory Visit: Payer: Self-pay | Admitting: Oncology

## 2022-11-14 DIAGNOSIS — C50212 Malignant neoplasm of upper-inner quadrant of left female breast: Secondary | ICD-10-CM

## 2022-11-15 ENCOUNTER — Telehealth: Payer: Self-pay | Admitting: Oncology

## 2022-11-15 NOTE — Telephone Encounter (Signed)
Patient has been scheduled. Aware of appt date and time.   RE: Mammogram Received: Gaspar Skeeters, Gardiner Fanti, MD  Otilio Miu; Jeannette Corpus, LPN She also missed her appt.  I ordered left mammo for 10/21, that is the soonest she can have it Then sched f/u with labs end of that week       Previous Messages    ----- Message ----- From: Otilio Miu Sent: 11/14/2022   1:05 PM EDT To: Dellia Beckwith, MD; Jeannette Corpus, LPN Subject: Mammogram                                      Hello,  Pt called requesting we schedule her mammogram for 2024. May I have an order placed for that?  Thank you,  Holly F.

## 2022-11-22 ENCOUNTER — Other Ambulatory Visit: Payer: Self-pay | Admitting: Oncology

## 2022-11-22 ENCOUNTER — Telehealth: Payer: Self-pay

## 2022-11-22 DIAGNOSIS — E039 Hypothyroidism, unspecified: Secondary | ICD-10-CM

## 2022-11-23 NOTE — Telephone Encounter (Signed)
Confirmed pt's next 2 appt's.

## 2022-11-27 ENCOUNTER — Inpatient Hospital Stay: Payer: Medicare Other | Attending: Oncology

## 2022-11-27 DIAGNOSIS — Z1231 Encounter for screening mammogram for malignant neoplasm of breast: Secondary | ICD-10-CM | POA: Diagnosis not present

## 2022-11-27 DIAGNOSIS — Z79811 Long term (current) use of aromatase inhibitors: Secondary | ICD-10-CM | POA: Insufficient documentation

## 2022-11-27 DIAGNOSIS — M810 Age-related osteoporosis without current pathological fracture: Secondary | ICD-10-CM | POA: Diagnosis not present

## 2022-11-27 DIAGNOSIS — Z79899 Other long term (current) drug therapy: Secondary | ICD-10-CM | POA: Diagnosis not present

## 2022-11-27 DIAGNOSIS — E039 Hypothyroidism, unspecified: Secondary | ICD-10-CM

## 2022-11-27 DIAGNOSIS — C50211 Malignant neoplasm of upper-inner quadrant of right female breast: Secondary | ICD-10-CM | POA: Insufficient documentation

## 2022-11-27 DIAGNOSIS — Z170 Estrogen receptor positive status [ER+]: Secondary | ICD-10-CM | POA: Insufficient documentation

## 2022-11-27 LAB — CMP (CANCER CENTER ONLY)
ALT: 16 U/L (ref 0–44)
AST: 19 U/L (ref 15–41)
Albumin: 3.7 g/dL (ref 3.5–5.0)
Alkaline Phosphatase: 100 U/L (ref 38–126)
Anion gap: 10 (ref 5–15)
BUN: 21 mg/dL (ref 8–23)
CO2: 25 mmol/L (ref 22–32)
Calcium: 8.8 mg/dL — ABNORMAL LOW (ref 8.9–10.3)
Chloride: 103 mmol/L (ref 98–111)
Creatinine: 0.87 mg/dL (ref 0.44–1.00)
GFR, Estimated: >60 mL/min (ref >60)
Glucose, Bld: 115 mg/dL — ABNORMAL HIGH (ref 70–99)
Potassium: 3.7 mmol/L (ref 3.5–5.1)
Sodium: 138 mmol/L (ref 135–145)
Total Bilirubin: 0.5 mg/dL (ref 0.3–1.2)
Total Protein: 6.8 g/dL (ref 6.5–8.1)

## 2022-11-27 LAB — CBC WITH DIFFERENTIAL (CANCER CENTER ONLY)
Abs Immature Granulocytes: 0.03 10*3/uL (ref 0.00–0.07)
Basophils Absolute: 0.1 10*3/uL (ref 0.0–0.1)
Basophils Relative: 1 %
Eosinophils Absolute: 0.7 10*3/uL — ABNORMAL HIGH (ref 0.0–0.5)
Eosinophils Relative: 7 %
HCT: 41.5 % (ref 36.0–46.0)
Hemoglobin: 13 g/dL (ref 12.0–15.0)
Immature Granulocytes: 0 %
Lymphocytes Relative: 25 %
Lymphs Abs: 2.5 10*3/uL (ref 0.7–4.0)
MCH: 28.1 pg (ref 26.0–34.0)
MCHC: 31.3 g/dL (ref 30.0–36.0)
MCV: 89.8 fL (ref 80.0–100.0)
Monocytes Absolute: 0.9 10*3/uL (ref 0.1–1.0)
Monocytes Relative: 9 %
Neutro Abs: 6 10*3/uL (ref 1.7–7.7)
Neutrophils Relative %: 58 %
Platelet Count: 423 10*3/uL — ABNORMAL HIGH (ref 150–400)
RBC: 4.62 MIL/uL (ref 3.87–5.11)
RDW: 13.5 % (ref 11.5–15.5)
WBC Count: 10.2 10*3/uL (ref 4.0–10.5)
nRBC: 0 % (ref 0.0–0.2)

## 2022-11-27 LAB — TSH: TSH: 0.794 u[IU]/mL (ref 0.350–4.500)

## 2022-11-27 LAB — HM MAMMOGRAPHY

## 2022-11-28 LAB — T4: T4, Total: 13.3 ug/dL — ABNORMAL HIGH (ref 4.5–12.0)

## 2022-11-28 NOTE — Progress Notes (Signed)
[facility]  [address] 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  She was treated with lumpectomy.  Pathology revealed a 0.5 cm invasive ductal carcinoma in the background of high-grade ductal carcinoma in situ with a negative sentinel node.  Estrogen and progesterone receptors were positive and HER 2 negative.  Adjuvant chemotherapy was not recommended.  She did received postoperative radiation to the left breast and then was placed on anastrozole 1 mg daily in May 2012. She did not tolerate anastrozole due to malaise, so was switched to letrozole and did not tolerate that either.  Eventually she refused all hormonal therapy.  She underwent cholecystectomy in December 2016 and was in the hospital for 7 days.    We scheduled her for mammogram in February 2019, which revealed 2 suspicious masses in the right breast and biopsy was recommended.  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  The imaging appeared to be a 1.5 cm lesion.  Estrogen and progesterone receptors were highly positive with HER 2 negative, and a Ki 67 of 25%.  A second biopsy at 1 o'clock was benign, but felt to be discordant.  We had been concerned about her memory.  She underwent right mastectomy in April 2019.  Pathology revealed a multicentric invasive ductal carcinoma with 5 negative nodes.  One lesion was grade 3 and 15 mm in size, and the other was grade 1 and 13 mm lesion in size, for a stage IA (T1c N0 M0) hormone  receptor positive right breast cancer.  Bone density scan in June 2019 revealed worsening osteopenia of the femur for a T-score of -2.4 and stable osteopenia of the spine with a T-score of -1.9.  She was placed on adjuvant hormonal therapy with letrozole 2.5 mg in June 2019. She initially tolerated this fairly well, but later stopped it on her own.  In August 2019, she develops to small nodules of the right mastectomy site.  Biopsy of both of these in September 2019 revealed fat necrosis.  Bone density scan from June revealed osteoporosis with a T-score of -2.7 of the right femur, previously -2.2, and a T-score of-2.4 in the spine, previously -1.9.  Left screening mammogram in July did not reveal any evidence of malignancy. Due to the osteoporosis, we recommended that she be placed on Prolia every 6 months and she received her 1st dose on July 19th.  It was stopped after 2 doses due to right jaw pain and cramping.  She has been on and off hormonal therapy with letrozole and anastrozole, but currently is compliant.    INTERVAL HISTORY:  Holly Welch is here for routine follow up for multicentric stage IA hormone receptor positive right breast cancer. She she had a stroke in August 2022 and again in May 2023. Patient states that she feels ok and complains of left knee pain. She informed me that she hurt her knee this morning while bending down the side of her left knee popped. I will order an X-ray of the left  knee. I will call her with the results and informed her if the scan is clear she will just have to elevate her knee and ice it. If this worsens then I will refer her to a orthopedic specialist. She continues Anastrozole 1mg  without difficulty. She had a mammogram done on 11/27/2022 that was clear. She had labs done on 11/27/2022 that showed a WBC is 10.2, hemoglobin is 13.0, and platelet count is mildly elevated at 423,000. Her CMP is normal other than a low calcium of 8.8. Her TSH is normal at 0.794 and her T4 is  slightly elevated at 13.3. I instructed her to start taking calcium 600mg  with vitamin D once daily. I will send a copy of her labs to [facility] as they have had her on Lasix for several weeks and need to see her renal function, which is good. She is also on Aspirin 325mg  daily . I will see her back in 1 year with CBC, CMP, and left mammogram.   She denies signs of infection such as sore throat, sinus drainage, cough, or urinary symptoms.  She denies fevers or recurrent chills. She denies pain. She denies nausea, vomiting, chest pain, dyspnea or cough. Her appetite is good and her weight has increased 3 pounds over last 2 months .  REVIEW OF SYSTEMS:  Review of Systems  Constitutional: Negative.  Negative for appetite change, chills, diaphoresis, fatigue, fever and unexpected weight change.  HENT:  Negative.  Negative for hearing loss, lump/mass, mouth sores, nosebleeds, sore throat, tinnitus, trouble swallowing and voice change.   Eyes: Negative.  Negative for eye problems and icterus.  Respiratory: Negative.  Negative for chest tightness, cough, hemoptysis, shortness of breath and wheezing.   Cardiovascular: Negative.  Negative for chest pain, leg swelling and palpitations.  Gastrointestinal: Negative.  Negative for abdominal distention, abdominal pain, blood in stool, constipation, diarrhea, nausea, rectal pain and vomiting.  Endocrine: Negative.   Genitourinary: Negative.  Negative for bladder incontinence, difficulty urinating, dyspareunia, dysuria, frequency, hematuria, menstrual problem, nocturia, pelvic pain, vaginal bleeding and vaginal discharge.   Musculoskeletal:  Positive for arthralgias (especially of the left knee). Negative for back pain, flank pain, gait problem, myalgias, neck pain and neck stiffness.       Left knee pain injured this morning  Skin: Negative.  Negative for itching, rash and wound.  Neurological: Negative.  Negative for dizziness, extremity weakness, gait  problem, headaches, light-headedness, numbness, seizures and speech difficulty.  Hematological: Negative.  Negative for adenopathy. Does not bruise/bleed easily.  Psychiatric/Behavioral: Negative.  Negative for confusion, decreased concentration, depression, sleep disturbance and suicidal ideas. The patient is not nervous/anxious.      VITALS:  Blood pressure (!) 152/74, pulse 85, temperature (!) 97.5 F (36.4 C), temperature source Oral, resp. rate 18, height 5' 2.75" (1.594 m), weight 151 lb 1.6 oz (68.5 kg), SpO2 94%.  Wt Readings from Last 3 Encounters:  11/30/22 151 lb 1.6 oz (68.5 kg)  09/23/21 148 lb (67.1 kg)  08/16/20 146 lb 8 oz (66.5 kg)    Body mass index is 26.98 kg/m.  Performance status (ECOG): 1 - Symptomatic but completely ambulatory  PHYSICAL EXAM:  Physical Exam Vitals and nursing note reviewed.  Constitutional:      General: She is not in acute distress.    Appearance: Normal appearance. She is normal weight. She is not ill-appearing, toxic-appearing or diaphoretic.  HENT:     Head: Normocephalic and atraumatic.     Right Ear: Tympanic membrane, ear  canal and external ear normal. There is no impacted cerumen.     Left Ear: Tympanic membrane, ear canal and external ear normal. There is no impacted cerumen.     Nose: Nose normal. No congestion or rhinorrhea.     Mouth/Throat:     Mouth: Mucous membranes are moist.     Pharynx: Oropharynx is clear. No oropharyngeal exudate or posterior oropharyngeal erythema.  Eyes:     General: No scleral icterus.       Right eye: No discharge.        Left eye: No discharge.     Extraocular Movements: Extraocular movements intact.     Conjunctiva/sclera: Conjunctivae normal.     Pupils: Pupils are equal, round, and reactive to light.  Neck:     Vascular: No carotid bruit.  Cardiovascular:     Rate and Rhythm: Normal rate and regular rhythm.     Pulses: Normal pulses.     Heart sounds: Normal heart sounds. No murmur  heard.    No friction rub. No gallop.  Pulmonary:     Effort: Pulmonary effort is normal. No respiratory distress.     Breath sounds: Normal breath sounds. No stridor. No wheezing, rhonchi or rales.  Chest:     Chest wall: No tenderness.  Breasts:    Right: Absent.     Left: Normal.     Comments: Right Mastectomy is negative  Left breast has a well healed scar at the medial areolar complex at 9 o'clock and another scar in the upper inner quadrant of the left breast which is well healed. No masses in the left breast Abdominal:     General: Bowel sounds are normal. There is no distension.     Palpations: Abdomen is soft. There is no hepatomegaly, splenomegaly or mass.     Tenderness: There is no abdominal tenderness. There is no right CVA tenderness, left CVA tenderness, guarding or rebound.     Hernia: No hernia is present.  Musculoskeletal:        General: No swelling, deformity or signs of injury. Normal range of motion.     Cervical back: Normal range of motion and neck supple. No rigidity or tenderness.     Left knee: Tenderness present.     Right lower leg: No edema.     Left lower leg: No edema.     Comments: She has crepitation of the left knee. Today she has some swelling and some tenderness of the lateral left knee and some pain with extension.   Lymphadenopathy:     Cervical: No cervical adenopathy.     Right cervical: No superficial, deep or posterior cervical adenopathy.    Left cervical: No superficial, deep or posterior cervical adenopathy.     Upper Body:     Right upper body: No supraclavicular, axillary or pectoral adenopathy.     Left upper body: No supraclavicular, axillary or pectoral adenopathy.  Skin:    General: Skin is warm and dry.     Coloration: Skin is not jaundiced or pale.     Findings: No bruising, erythema, lesion or rash.     Comments: Scattered ecchymosis of bilateral arms  Neurological:     General: No focal deficit present.     Mental Status:  She is alert and oriented to person, place, and time. Mental status is at baseline.     Cranial Nerves: No cranial nerve deficit.     Sensory: No sensory deficit.  Motor: No weakness.     Coordination: Coordination normal.     Gait: Gait normal.     Deep Tendon Reflexes: Reflexes normal.  Psychiatric:        Mood and Affect: Mood normal.        Behavior: Behavior normal.        Thought Content: Thought content normal.        Judgment: Judgment normal.    LABS:      Latest Ref Rng & Units 11/27/2022    1:09 PM 09/23/2021   12:00 AM 08/16/2020   12:00 AM  CBC  WBC 4.0 - 10.5 K/uL 10.2  9.8     8.0      Hemoglobin 12.0 - 15.0 g/dL 13.2  44.0     10.2      Hematocrit 36.0 - 46.0 % 41.5  40     37      Platelets 150 - 400 K/uL 423  416     348         This result is from an external source.      Latest Ref Rng & Units 11/27/2022    1:09 PM 09/23/2021   12:00 AM 08/16/2020   12:00 AM  CMP  Glucose 70 - 99 mg/dL 725     BUN 8 - 23 mg/dL 21  18     15       Creatinine 0.44 - 1.00 mg/dL 3.66  0.8     0.9      Sodium 135 - 145 mmol/L 138  139     133      Potassium 3.5 - 5.1 mmol/L 3.7  3.8     4.4      Chloride 98 - 111 mmol/L 103  104     101      CO2 22 - 32 mmol/L 25  29     23       Calcium 8.9 - 10.3 mg/dL 8.8  9.3     8.6      Total Protein 6.5 - 8.1 g/dL 6.8     Total Bilirubin 0.3 - 1.2 mg/dL 0.5     Alkaline Phos 38 - 126 U/L 100  116     54      AST 15 - 41 U/L 19  25     28       ALT 0 - 44 U/L 16  21     17          This result is from an external source.   Component Ref Range & Units 11/27/2022  TSH 0.350 - 4.500 uIU/mL 0.794     Component Ref Range & Units 11/27/2022  T4, Total 4.5 - 12.0 ug/dL 44.0 High      Component Ref Range & Units 08/03/2022  Activated Partial Thromboplastin Time (aPTT) 23.8 - 35.4 seconds 24.1   STUDIES:  Exam: 11/27/2022  Digital Screening Unilateral Left Mammogram with CAD and Tomosynthesis Impression: No mammographic  evidence of malignancy.    Allergies:  Allergies  Allergen Reactions   Cefdinir Nausea Only and Other (See Comments)   Hydrocodone-Acetaminophen Other (See Comments)   Latuda [Lurasidone]    Prednisone    Codeine Other (See Comments), Rash and Nausea And Vomiting   Penicillins Rash and Swelling   Sulfa Antibiotics Rash    Current Medications: Current Outpatient Medications  Medication Sig Dispense Refill   ALPRAZolam (XANAX) 1 MG tablet Take 0.5-1 mg by mouth daily as  needed.     amLODipine (NORVASC) 5 MG tablet Take 5 mg by mouth daily.     atorvastatin (LIPITOR) 40 MG tablet Take 40 mg by mouth daily.     carteolol (OCUPRESS) 1 % ophthalmic solution Place 1 drop into the left eye 2 (two) times daily.     cyanocobalamin (VITAMIN B12) 1000 MCG tablet Take 1 tablet by mouth daily.     denosumab (PROLIA) 60 MG/ML SOSY injection Inject 60 mg into the skin every 6 (six) months.     famotidine (PEPCID) 40 MG tablet Take 40 mg by mouth 2 (two) times daily as needed.     ibuprofen (ADVIL) 600 MG tablet Take by mouth.     levothyroxine (SYNTHROID) 75 MCG tablet Take 75 mcg by mouth daily before breakfast.     MAGNESIUM PO Take 2 tablets by mouth daily.     metoprolol succinate (TOPROL-XL) 50 MG 24 hr tablet Take 50 mg by mouth daily.     mirtazapine (REMERON) 15 MG tablet Take 15 mg by mouth at bedtime.     nitroGLYCERIN (NITROSTAT) 0.4 MG SL tablet Place 0.4 mg under the tongue as needed for chest pain.     sucralfate (CARAFATE) 1 g tablet Take 1 g by mouth 2 (two) times daily.     terbinafine (LAMISIL) 250 MG tablet Take 250 mg by mouth daily.     venlafaxine (EFFEXOR) 75 MG tablet Take 75 mg by mouth daily.     No current facility-administered medications for this visit.     ASSESSMENT & PLAN:  Assessment:   1. History of stage IA hormone receptor positive left breast cancer December 2011 treated with lumpectomy and postoperative radiation, but she was unable to tolerate hormonal  therapy.    2. 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  She has been on and off adjuvant hormonal therapy, due to difficulty tolerating it.  She has resumed anastrozole without difficulty.    3. Osteoporosis, for which she was started on Prolia every 6 months in July 2021.  She has received 2 doses but it was stopped due to toxicities.  Her last DEXA had shown progression from osteopenia to osteoporosis, but not severe, with a T score of 2.7. Her bone density from 11/25/2021 revealed osteopenia of the spine which was slightly improved but osteoporosis of the femur. I encouraged her to resume/continue calcium supplements with vitamin D.   4.  Nodules of the right mastectomy incision.  Biopsies revealed fat necrosis. She just has a mild firmness there now.  5. Thyroid nodules, one measures up to 1.2cm but we will continue observation and repeat ultrasound in 1 year.  6. Severe pain of the left knee which occurred acutely this morning. I will have her get a X-ray today and call her with the results. If she continues to have worsening pain, I will recommend a orthopedic consultation.   Plan: I have  ordered an X-ray of the left knee. I will call her with the results and informed her if the xray is clear she will just have to elevate her knee and ice it. If this worsens then I will refer her to a orthopedic specialist. She continues Anastrozole 1mg  without difficulty. She had a mammogram done on 11/27/2022 that was clear. She had labs done on 11/27/2022 that showed a WBC is 10.2, hemoglobin is 13.0, and platelet count is mildly elevated at 423,000. Her CMP  is normal other than a low calcium of 8.8. Her TSH is normal at 0.794 and her T4 is slightly elevated at 13.3. I instructed her to start taking calcium 600mg  with vitamin D once daily. I will send a copy of her labs to [facility] as they have had her on Lasix for  several weeks and need to see her renal function, which is good. She is also on Aspirin 325mg  daily . I will see her back in 1 year with CBC, CMP, and left mammogram. The patient understands the plans discussed today and is in agreement with them.  She knows to contact our office if she develops concerns regarding her breast cancer or its treatment.  I provided 19 minutes of face-to-face time during this this encounter and > 50% was spent counseling as documented under my assessment and plan.    Dellia Beckwith, MD [facility] AT [facility] [address] [place] [address] Dept: [phone] Dept Fax: [phone]    Rulon Sera Lassiter,acting as a scribe for Dellia Beckwith, MD.,have documented all relevant documentation on the behalf of Dellia Beckwith, MD,as directed by  Dellia Beckwith, MD while in the presence of Dellia Beckwith, MD.

## 2022-11-30 ENCOUNTER — Other Ambulatory Visit: Payer: Self-pay | Admitting: Oncology

## 2022-11-30 ENCOUNTER — Encounter: Payer: Self-pay | Admitting: Oncology

## 2022-11-30 ENCOUNTER — Other Ambulatory Visit: Payer: Medicare Other

## 2022-11-30 ENCOUNTER — Inpatient Hospital Stay: Payer: Medicare Other | Admitting: Oncology

## 2022-11-30 VITALS — BP 152/74 | HR 85 | Temp 97.5°F | Resp 18 | Ht 62.75 in | Wt 151.1 lb

## 2022-11-30 DIAGNOSIS — M810 Age-related osteoporosis without current pathological fracture: Secondary | ICD-10-CM | POA: Diagnosis not present

## 2022-11-30 DIAGNOSIS — Z170 Estrogen receptor positive status [ER+]: Secondary | ICD-10-CM

## 2022-11-30 DIAGNOSIS — C50211 Malignant neoplasm of upper-inner quadrant of right female breast: Secondary | ICD-10-CM

## 2022-11-30 DIAGNOSIS — C50212 Malignant neoplasm of upper-inner quadrant of left female breast: Secondary | ICD-10-CM

## 2022-11-30 DIAGNOSIS — M85862 Other specified disorders of bone density and structure, left lower leg: Secondary | ICD-10-CM | POA: Diagnosis not present

## 2022-11-30 DIAGNOSIS — E042 Nontoxic multinodular goiter: Secondary | ICD-10-CM

## 2022-11-30 DIAGNOSIS — M25562 Pain in left knee: Secondary | ICD-10-CM | POA: Diagnosis not present

## 2022-11-30 DIAGNOSIS — Z79899 Other long term (current) drug therapy: Secondary | ICD-10-CM | POA: Diagnosis not present

## 2022-11-30 DIAGNOSIS — M1712 Unilateral primary osteoarthritis, left knee: Secondary | ICD-10-CM | POA: Diagnosis not present

## 2022-11-30 DIAGNOSIS — Z79811 Long term (current) use of aromatase inhibitors: Secondary | ICD-10-CM | POA: Diagnosis not present

## 2022-12-01 ENCOUNTER — Telehealth: Payer: Self-pay

## 2022-12-01 ENCOUNTER — Telehealth: Payer: Self-pay | Admitting: Oncology

## 2022-12-01 NOTE — Telephone Encounter (Signed)
Patient awaiting xray results of her knee , Holly Welch aware and will check with [facility] radiology for a report. Patient aware she also informed us she may be seen by [facility] orthopedic today she called them and is waiting for a return call.

## 2022-12-01 NOTE — Telephone Encounter (Signed)
12/01/22 Spoke with patient and confirmed next appt.Sent mammogram order to [facility] scheduling.

## 2022-12-04 DIAGNOSIS — M1712 Unilateral primary osteoarthritis, left knee: Secondary | ICD-10-CM | POA: Diagnosis not present

## 2022-12-14 DIAGNOSIS — Z23 Encounter for immunization: Secondary | ICD-10-CM | POA: Diagnosis not present

## 2022-12-18 ENCOUNTER — Encounter: Payer: Self-pay | Admitting: Oncology

## 2022-12-21 DIAGNOSIS — E042 Nontoxic multinodular goiter: Secondary | ICD-10-CM | POA: Insufficient documentation

## 2022-12-26 DIAGNOSIS — D485 Neoplasm of uncertain behavior of skin: Secondary | ICD-10-CM | POA: Diagnosis not present

## 2022-12-26 DIAGNOSIS — L570 Actinic keratosis: Secondary | ICD-10-CM | POA: Diagnosis not present

## 2022-12-26 DIAGNOSIS — D225 Melanocytic nevi of trunk: Secondary | ICD-10-CM | POA: Diagnosis not present

## 2022-12-26 DIAGNOSIS — D2239 Melanocytic nevi of other parts of face: Secondary | ICD-10-CM | POA: Diagnosis not present

## 2022-12-26 DIAGNOSIS — L814 Other melanin hyperpigmentation: Secondary | ICD-10-CM | POA: Diagnosis not present

## 2022-12-26 DIAGNOSIS — L821 Other seborrheic keratosis: Secondary | ICD-10-CM | POA: Diagnosis not present

## 2023-01-09 DIAGNOSIS — D0439 Carcinoma in situ of skin of other parts of face: Secondary | ICD-10-CM | POA: Diagnosis not present

## 2023-01-24 DIAGNOSIS — R7302 Impaired glucose tolerance (oral): Secondary | ICD-10-CM | POA: Diagnosis not present

## 2023-01-24 DIAGNOSIS — Z79899 Other long term (current) drug therapy: Secondary | ICD-10-CM | POA: Diagnosis not present

## 2023-01-24 DIAGNOSIS — M810 Age-related osteoporosis without current pathological fracture: Secondary | ICD-10-CM | POA: Diagnosis not present

## 2023-01-24 DIAGNOSIS — Z0000 Encounter for general adult medical examination without abnormal findings: Secondary | ICD-10-CM | POA: Diagnosis not present

## 2023-01-24 DIAGNOSIS — E890 Postprocedural hypothyroidism: Secondary | ICD-10-CM | POA: Diagnosis not present

## 2023-01-24 DIAGNOSIS — E782 Mixed hyperlipidemia: Secondary | ICD-10-CM | POA: Diagnosis not present

## 2023-01-24 DIAGNOSIS — I10 Essential (primary) hypertension: Secondary | ICD-10-CM | POA: Diagnosis not present

## 2023-01-24 DIAGNOSIS — G4700 Insomnia, unspecified: Secondary | ICD-10-CM | POA: Diagnosis not present

## 2023-01-24 DIAGNOSIS — J301 Allergic rhinitis due to pollen: Secondary | ICD-10-CM | POA: Diagnosis not present

## 2023-01-24 DIAGNOSIS — I201 Angina pectoris with documented spasm: Secondary | ICD-10-CM | POA: Diagnosis not present

## 2023-03-13 DIAGNOSIS — H40052 Ocular hypertension, left eye: Secondary | ICD-10-CM | POA: Diagnosis not present

## 2023-03-16 DIAGNOSIS — R0981 Nasal congestion: Secondary | ICD-10-CM | POA: Diagnosis not present

## 2023-03-16 DIAGNOSIS — R059 Cough, unspecified: Secondary | ICD-10-CM | POA: Diagnosis not present

## 2023-03-16 DIAGNOSIS — R5383 Other fatigue: Secondary | ICD-10-CM | POA: Diagnosis not present

## 2023-03-20 DIAGNOSIS — I088 Other rheumatic multiple valve diseases: Secondary | ICD-10-CM | POA: Diagnosis not present

## 2023-04-25 DIAGNOSIS — I340 Nonrheumatic mitral (valve) insufficiency: Secondary | ICD-10-CM | POA: Diagnosis not present

## 2023-04-25 DIAGNOSIS — I371 Nonrheumatic pulmonary valve insufficiency: Secondary | ICD-10-CM | POA: Diagnosis not present

## 2023-04-25 DIAGNOSIS — I619 Nontraumatic intracerebral hemorrhage, unspecified: Secondary | ICD-10-CM | POA: Diagnosis not present

## 2023-04-25 DIAGNOSIS — L821 Other seborrheic keratosis: Secondary | ICD-10-CM | POA: Diagnosis not present

## 2023-04-25 DIAGNOSIS — I447 Left bundle-branch block, unspecified: Secondary | ICD-10-CM | POA: Diagnosis not present

## 2023-04-25 DIAGNOSIS — E039 Hypothyroidism, unspecified: Secondary | ICD-10-CM | POA: Diagnosis not present

## 2023-04-25 DIAGNOSIS — E782 Mixed hyperlipidemia: Secondary | ICD-10-CM | POA: Diagnosis not present

## 2023-04-25 DIAGNOSIS — D0439 Carcinoma in situ of skin of other parts of face: Secondary | ICD-10-CM | POA: Diagnosis not present

## 2023-04-25 DIAGNOSIS — I10 Essential (primary) hypertension: Secondary | ICD-10-CM | POA: Diagnosis not present

## 2023-06-01 NOTE — Addendum Note (Signed)
 Addended by: Dariel Pellecchia M on: 06/01/2023 10:23 AM   Modules accepted: Orders

## 2023-08-02 DIAGNOSIS — H40052 Ocular hypertension, left eye: Secondary | ICD-10-CM | POA: Diagnosis not present

## 2023-08-07 ENCOUNTER — Telehealth: Payer: Self-pay | Admitting: Oncology

## 2023-08-07 NOTE — Telephone Encounter (Signed)
 Pt called to confirm Oct appts for [facility] and follow up in clinic. Appts reviewed and confirmed with pt.

## 2023-08-28 DIAGNOSIS — J329 Chronic sinusitis, unspecified: Secondary | ICD-10-CM | POA: Diagnosis not present

## 2023-10-10 DIAGNOSIS — H40052 Ocular hypertension, left eye: Secondary | ICD-10-CM | POA: Diagnosis not present

## 2023-10-10 DIAGNOSIS — H5211 Myopia, right eye: Secondary | ICD-10-CM | POA: Diagnosis not present

## 2023-11-06 DIAGNOSIS — H40052 Ocular hypertension, left eye: Secondary | ICD-10-CM | POA: Diagnosis not present

## 2023-11-27 DIAGNOSIS — Z23 Encounter for immunization: Secondary | ICD-10-CM | POA: Diagnosis not present

## 2023-11-28 ENCOUNTER — Inpatient Hospital Stay (HOSPITAL_BASED_OUTPATIENT_CLINIC_OR_DEPARTMENT_OTHER)
Admission: RE | Admit: 2023-11-28 | Discharge: 2023-11-28 | Disposition: A | Source: Ambulatory Visit | Attending: Oncology | Admitting: Oncology

## 2023-11-28 DIAGNOSIS — Z170 Estrogen receptor positive status [ER+]: Secondary | ICD-10-CM | POA: Diagnosis not present

## 2023-11-28 DIAGNOSIS — C50212 Malignant neoplasm of upper-inner quadrant of left female breast: Secondary | ICD-10-CM | POA: Diagnosis not present

## 2023-11-28 DIAGNOSIS — Z1231 Encounter for screening mammogram for malignant neoplasm of breast: Secondary | ICD-10-CM | POA: Diagnosis not present

## 2023-12-04 ENCOUNTER — Encounter: Payer: Self-pay | Admitting: Oncology

## 2023-12-04 ENCOUNTER — Inpatient Hospital Stay: Payer: Medicare Other | Attending: Oncology | Admitting: Oncology

## 2023-12-04 ENCOUNTER — Inpatient Hospital Stay: Payer: Medicare Other

## 2023-12-04 ENCOUNTER — Other Ambulatory Visit: Payer: Self-pay

## 2023-12-04 ENCOUNTER — Other Ambulatory Visit: Payer: Self-pay | Admitting: Oncology

## 2023-12-04 ENCOUNTER — Telehealth: Payer: Self-pay | Admitting: Oncology

## 2023-12-04 VITALS — BP 149/57 | HR 79 | Temp 98.0°F | Resp 16 | Ht 62.75 in | Wt 153.3 lb

## 2023-12-04 DIAGNOSIS — C50212 Malignant neoplasm of upper-inner quadrant of left female breast: Secondary | ICD-10-CM

## 2023-12-04 DIAGNOSIS — Z170 Estrogen receptor positive status [ER+]: Secondary | ICD-10-CM

## 2023-12-04 DIAGNOSIS — Z9011 Acquired absence of right breast and nipple: Secondary | ICD-10-CM | POA: Diagnosis not present

## 2023-12-04 DIAGNOSIS — Z79899 Other long term (current) drug therapy: Secondary | ICD-10-CM | POA: Diagnosis not present

## 2023-12-04 DIAGNOSIS — M810 Age-related osteoporosis without current pathological fracture: Secondary | ICD-10-CM | POA: Insufficient documentation

## 2023-12-04 DIAGNOSIS — Z923 Personal history of irradiation: Secondary | ICD-10-CM | POA: Diagnosis not present

## 2023-12-04 DIAGNOSIS — Z853 Personal history of malignant neoplasm of breast: Secondary | ICD-10-CM | POA: Diagnosis present

## 2023-12-04 DIAGNOSIS — E042 Nontoxic multinodular goiter: Secondary | ICD-10-CM | POA: Diagnosis not present

## 2023-12-04 LAB — CBC WITH DIFFERENTIAL (CANCER CENTER ONLY)
Abs Immature Granulocytes: 0.03 K/uL (ref 0.00–0.07)
Basophils Absolute: 0.1 K/uL (ref 0.0–0.1)
Basophils Relative: 1 %
Eosinophils Absolute: 0.4 K/uL (ref 0.0–0.5)
Eosinophils Relative: 4 %
HCT: 39.5 % (ref 36.0–46.0)
Hemoglobin: 12.7 g/dL (ref 12.0–15.0)
Immature Granulocytes: 0 %
Lymphocytes Relative: 25 %
Lymphs Abs: 2.5 K/uL (ref 0.7–4.0)
MCH: 27.4 pg (ref 26.0–34.0)
MCHC: 32.2 g/dL (ref 30.0–36.0)
MCV: 85.1 fL (ref 80.0–100.0)
Monocytes Absolute: 0.8 K/uL (ref 0.1–1.0)
Monocytes Relative: 8 %
Neutro Abs: 6.2 K/uL (ref 1.7–7.7)
Neutrophils Relative %: 62 %
Platelet Count: 423 K/uL — ABNORMAL HIGH (ref 150–400)
RBC: 4.64 MIL/uL (ref 3.87–5.11)
RDW: 15 % (ref 11.5–15.5)
WBC Count: 10 K/uL (ref 4.0–10.5)
nRBC: 0 % (ref 0.0–0.2)

## 2023-12-04 LAB — CMP (CANCER CENTER ONLY)
ALT: 19 U/L (ref 0–44)
AST: 23 U/L (ref 15–41)
Albumin: 4.3 g/dL (ref 3.5–5.0)
Alkaline Phosphatase: 133 U/L — ABNORMAL HIGH (ref 38–126)
Anion gap: 12 (ref 5–15)
BUN: 16 mg/dL (ref 8–23)
CO2: 26 mmol/L (ref 22–32)
Calcium: 9.4 mg/dL (ref 8.9–10.3)
Chloride: 105 mmol/L (ref 98–111)
Creatinine: 0.9 mg/dL (ref 0.44–1.00)
GFR, Estimated: >60 mL/min (ref >60)
Glucose, Bld: 105 mg/dL — ABNORMAL HIGH (ref 70–99)
Potassium: 3.9 mmol/L (ref 3.5–5.1)
Sodium: 144 mmol/L (ref 135–145)
Total Bilirubin: 0.5 mg/dL (ref 0.0–1.2)
Total Protein: 6.8 g/dL (ref 6.5–8.1)

## 2023-12-04 NOTE — Progress Notes (Addendum)
 [facility]  [address] 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  She was treated with lumpectomy.  Pathology revealed a 0.5 cm invasive ductal carcinoma in the background of high-grade ductal carcinoma in situ with a negative sentinel node.  Estrogen and progesterone receptors were positive and HER 2 negative.  Adjuvant chemotherapy was not recommended.  She did received postoperative radiation to the left breast and then was placed on anastrozole  1 mg daily in May 2012. She did not tolerate anastrozole  due to malaise, so was switched to letrozole and did not tolerate that either.  Eventually she refused all hormonal therapy.  She underwent cholecystectomy in December 2016 and was in the hospital for 7 days.    We scheduled her for mammogram in February 2019, which revealed 2 suspicious masses in the right breast and biopsy was recommended.  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  The imaging appeared to be a 1.5 cm lesion.  Estrogen and progesterone receptors were highly positive with HER 2 negative, and a Ki 67 of 25%.  A second biopsy at 1 o'clock was benign, but felt to be discordant.  We had been concerned about her memory.  She underwent right mastectomy in April 2019.  Pathology revealed a multicentric invasive ductal carcinoma with 5 negative nodes.  One lesion was grade 3 and 15 mm in size, and the other was grade 1 and 13 mm lesion in size, for a stage IA (T1c N0 M0) hormone receptor positive right  breast cancer.  Bone density scan in June 2019 revealed worsening osteopenia of the femur for a T-score of -2.4 and stable osteopenia of the spine with a T-score of -1.9.  She was placed on adjuvant hormonal therapy with letrozole 2.5 mg in June 2019. She initially tolerated this fairly well, but later stopped it on her own.  In August 2019, she develops to small nodules of the right mastectomy site.  Biopsy of both of these in September 2019 revealed fat necrosis.  Bone density scan from June revealed osteoporosis with a T-score of -2.7 of the right femur, previously -2.2, and a T-score of-2.4 in the spine, previously -1.9.  Left screening mammogram in July did not reveal any evidence of malignancy. Due to the osteoporosis, we recommended that she be placed on Prolia  every 6 months and she received her 1st dose on July 19th.  It was stopped after 2 doses due to right jaw pain and cramping.  She has been on and off hormonal therapy with letrozole and anastrozole , but currently is compliant.    INTERVAL HISTORY:  Holly Welch is here for routine follow up for multicentric stage IA hormone receptor positive right breast cancer in 2019. She also had a remote history of a stage 1A left breast cancer in 2011. She had a stroke in August 2022 and again in May 2023. She has also completed 5 years of Anastrozole . Patient states that she feels well and has no complaints of pain. She informed me that she received  her flu shot recently and soon after she had a completely yellow bowel movement. She is unsure of the cause of this. She had a screening unilateral left mammogram done on 11/28/2023 that was clear. She has a WBC of 10.0, hemoglobin of 12.7, and platelet count of 423,000. Her CMP is normal other than an elevated alkaline phosphatase of 133. I informed her she qualifies for our long-term survivorship clinic and she will return in 1 year with CBC, CMP, and screening unilateral left mammogram for our long-term survivorship  clinic. She will also be due for a new DEXA scan by then if not already done at [facility] practice.  I don't know when her last thyroid  US  was done.  She denies fever, chills, night sweats, or other signs of infection. She denies cardiorespiratory and gastrointestinal issues. She denies pain. Her appetite is good and Her weight has increased 2 pounds over last year.   REVIEW OF SYSTEMS:  Review of Systems  Constitutional: Negative.  Negative for appetite change, chills, diaphoresis, fatigue, fever and unexpected weight change.  HENT:  Negative.  Negative for hearing loss, lump/mass, mouth sores, nosebleeds, sore throat, tinnitus, trouble swallowing and voice change.   Eyes: Negative.  Negative for eye problems and icterus.  Respiratory: Negative.  Negative for chest tightness, cough, hemoptysis, shortness of breath and wheezing.   Cardiovascular: Negative.  Negative for chest pain, leg swelling and palpitations.  Gastrointestinal: Negative.  Negative for abdominal distention, abdominal pain, blood in stool, constipation, diarrhea, nausea, rectal pain and vomiting.  Endocrine: Negative.   Genitourinary: Negative.  Negative for bladder incontinence, difficulty urinating, dyspareunia, dysuria, frequency, hematuria, menstrual problem, nocturia, pelvic pain, vaginal bleeding and vaginal discharge.   Musculoskeletal:  Positive for arthralgias. Negative for back pain, flank pain, gait problem, myalgias, neck pain and neck stiffness.  Skin: Negative.  Negative for itching, rash and wound.  Neurological: Negative.  Negative for dizziness, extremity weakness, gait problem, headaches, light-headedness, numbness, seizures and speech difficulty.  Hematological: Negative.  Negative for adenopathy. Does not bruise/bleed easily.  Psychiatric/Behavioral: Negative.  Negative for confusion, decreased concentration, depression, sleep disturbance and suicidal ideas. The patient is not nervous/anxious.       VITALS:  Blood pressure (!) 149/57, pulse 79, temperature 98 F (36.7 C), temperature source Oral, resp. rate 16, height 5' 2.75 (1.594 m), weight 153 lb 4.8 oz (69.5 kg), SpO2 96%.  Wt Readings from Last 3 Encounters:  12/04/23 153 lb 4.8 oz (69.5 kg)  11/30/22 151 lb 1.6 oz (68.5 kg)  09/23/21 148 lb (67.1 kg)    Body mass index is 27.37 kg/m.  Performance status (ECOG): 1 - Symptomatic but completely ambulatory  PHYSICAL EXAM:  Physical Exam Vitals and nursing note reviewed.  Constitutional:      General: She is not in acute distress.    Appearance: Normal appearance. She is normal weight. She is not ill-appearing, toxic-appearing or diaphoretic.  HENT:     Head: Normocephalic and atraumatic.     Right Ear: Tympanic membrane, ear canal and external ear normal. There is no impacted cerumen.     Left Ear: Tympanic membrane, ear canal and external ear normal. There is no impacted cerumen.     Nose: Nose normal. No congestion or rhinorrhea.     Mouth/Throat:     Mouth: Mucous membranes are moist.     Pharynx: Oropharynx is clear. No oropharyngeal exudate or posterior oropharyngeal erythema.  Eyes:     General: No scleral icterus.  Right eye: No discharge.        Left eye: No discharge.     Extraocular Movements: Extraocular movements intact.     Conjunctiva/sclera: Conjunctivae normal.     Pupils: Pupils are equal, round, and reactive to light.  Neck:     Vascular: No carotid bruit.  Cardiovascular:     Rate and Rhythm: Normal rate and regular rhythm.     Pulses: Normal pulses.     Heart sounds: Normal heart sounds. No murmur heard.    No friction rub. No gallop.  Pulmonary:     Effort: Pulmonary effort is normal. No respiratory distress.     Breath sounds: Normal breath sounds. No stridor. No wheezing, rhonchi or rales.  Chest:     Chest wall: No tenderness.  Breasts:    Right: Absent.     Left: Normal.     Comments: Right Mastectomy is negative  Increased  nevi of her entire chest wall Well healed scar in the upper inner quadrant of the left breast and another right at the edge of the nipple areolar complex.  No masses in the left breast Abdominal:     General: Bowel sounds are normal. There is no distension.     Palpations: Abdomen is soft. There is no hepatomegaly, splenomegaly or mass.     Tenderness: There is no abdominal tenderness. There is no right CVA tenderness, left CVA tenderness, guarding or rebound.     Hernia: No hernia is present.  Musculoskeletal:        General: No swelling, deformity or signs of injury. Normal range of motion.     Cervical back: Normal range of motion and neck supple. No rigidity or tenderness.     Left knee: No tenderness.     Right lower leg: No edema.     Left lower leg: No edema.  Lymphadenopathy:     Cervical: No cervical adenopathy.     Right cervical: No superficial, deep or posterior cervical adenopathy.    Left cervical: No superficial, deep or posterior cervical adenopathy.     Upper Body:     Right upper body: No supraclavicular, axillary or pectoral adenopathy.     Left upper body: No supraclavicular, axillary or pectoral adenopathy.  Skin:    General: Skin is warm and dry.     Coloration: Skin is not jaundiced or pale.     Findings: No bruising, erythema, lesion or rash.  Neurological:     General: No focal deficit present.     Mental Status: She is alert and oriented to person, place, and time. Mental status is at baseline.     Cranial Nerves: No cranial nerve deficit.     Sensory: No sensory deficit.     Motor: No weakness.     Coordination: Coordination normal.     Gait: Gait normal.     Deep Tendon Reflexes: Reflexes normal.  Psychiatric:        Mood and Affect: Mood normal.        Behavior: Behavior normal.        Thought Content: Thought content normal.        Judgment: Judgment normal.    LABS:      Latest Ref Rng & Units 12/04/2023   10:49 AM 11/27/2022    1:09 PM  09/23/2021   12:00 AM  CBC  WBC 4.0 - 10.5 K/uL 10.0  10.2  9.8      Hemoglobin 12.0 - 15.0 g/dL  12.7  13.0  13.5      Hematocrit 36.0 - 46.0 % 39.5  41.5  40      Platelets 150 - 400 K/uL 423  423  416         This result is from an external source.      Latest Ref Rng & Units 12/04/2023   10:49 AM 11/27/2022    1:09 PM 09/23/2021   12:00 AM  CMP  Glucose 70 - 99 mg/dL 894  884    BUN 8 - 23 mg/dL 16  21  18       Creatinine 0.44 - 1.00 mg/dL 9.09  9.12  0.8      Sodium 135 - 145 mmol/L 144  138  139      Potassium 3.5 - 5.1 mmol/L 3.9  3.7  3.8      Chloride 98 - 111 mmol/L 105  103  104      CO2 22 - 32 mmol/L 26  25  29       Calcium 8.9 - 10.3 mg/dL 9.4  8.8  9.3      Total Protein 6.5 - 8.1 g/dL 6.8  6.8    Total Bilirubin 0.0 - 1.2 mg/dL 0.5  0.5    Alkaline Phos 38 - 126 U/L 133  100  116      AST 15 - 41 U/L 23  19  25       ALT 0 - 44 U/L 19  16  21          This result is from an external source.   STUDIES:  Exam: 11/28/2023 Digital Screening Unilateral Left Mammogram with CAD and Tomosynthesis Impression: No mammographic evidence of malignancy.    Allergies:  Allergies  Allergen Reactions   Cefdinir Nausea Only and Other (See Comments)   Hydrocodone-Acetaminophen  Other (See Comments)   Latuda [Lurasidone]    Prednisone    Codeine Other (See Comments), Rash and Nausea And Vomiting   Penicillins Rash and Swelling   Sulfa Antibiotics Rash    Current Medications: Current Outpatient Medications  Medication Sig Dispense Refill   ALPRAZolam (XANAX) 1 MG tablet Take 0.5-1 mg by mouth daily as needed.     amLODipine (NORVASC) 5 MG tablet Take 5 mg by mouth daily.     atorvastatin (LIPITOR) 40 MG tablet Take 40 mg by mouth daily.     carteolol (OCUPRESS) 1 % ophthalmic solution Place 1 drop into the left eye 2 (two) times daily.     cyanocobalamin (VITAMIN B12) 1000 MCG tablet Take 1 tablet by mouth daily.     denosumab  (PROLIA ) 60 MG/ML SOSY injection Inject 60  mg into the skin every 6 (six) months.     famotidine  (PEPCID ) 40 MG tablet Take 40 mg by mouth 2 (two) times daily as needed.     ibuprofen (ADVIL) 600 MG tablet Take by mouth.     levothyroxine (SYNTHROID) 75 MCG tablet Take 75 mcg by mouth daily before breakfast.     MAGNESIUM PO Take 2 tablets by mouth daily.     metoprolol succinate (TOPROL-XL) 50 MG 24 hr tablet Take 50 mg by mouth daily.     mirtazapine (REMERON) 15 MG tablet Take 15 mg by mouth at bedtime.     nitroGLYCERIN (NITROSTAT) 0.4 MG SL tablet Place 0.4 mg under the tongue as needed for chest pain.     sucralfate (CARAFATE) 1 g tablet Take 1 g by mouth 2 (two) times  daily.     terbinafine (LAMISIL) 250 MG tablet Take 250 mg by mouth daily.     venlafaxine (EFFEXOR) 75 MG tablet Take 75 mg by mouth daily.     No current facility-administered medications for this visit.   ASSESSMENT & PLAN:  Assessment:   1. History of stage IA hormone receptor positive left breast cancer December 2011 treated with lumpectomy and postoperative radiation, but she was unable to tolerate hormonal therapy.    2. 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  She has been on and off adjuvant hormonal therapy, due to difficulty tolerating it.  She has completed 5 years of anastrozole .    3. Osteoporosis, for which she was started on Prolia  every 6 months in July 2021.  She has received 2 doses but it was stopped due to toxicities.  Her last DEXA had shown progression from osteopenia to osteoporosis, but not severe, with a T score of 2.7. Her bone density from 11/25/2021 revealed osteopenia of the spine which was slightly improved but osteoporosis of the femur. I encouraged her to resume/continue calcium supplements with vitamin D.   4.  Nodules of the right mastectomy incision.  Biopsies revealed fat necrosis. She just has a mild firmness there now and multiple large  nevi.  5. Thyroid  nodules, one measures up to 1.2 cm but we will continue observation. She will need periodic ultrasounds.   Plan: The patient has completed 5 years of Anastrozole . She informed me that she received her flu shot recently and soon after she had a completely yellow bowel movement. She is unsure of the cause of this. She had a screening unilateral left mammogram done on 11/28/2023 that was clear. She has a WBC of 10.0, hemoglobin of 12.7, and platelet count of 423,000. Her CMP is normal other than an elevated alkaline phosphatase of 133. I informed her she qualifies for our long-term survivorship clinic and she will return in 1 year with CBC, CMP, and screening unilateral left mammogram for our long-term survivorship clinic.  She will also be due for a new DEXA scan by then if not already done at [facility] practice.  I don't know when her last thyroid  US  was done. The patient understands the plans discussed today and is in agreement with them.  She knows to contact our office if she develops concerns regarding her breast cancer or its treatment.  I provided 15 minutes of face-to-face time during this this encounter and > 50% was spent counseling as documented under my assessment and plan.   Wanda VEAR Cornish, MD  [facility] A DEPT OF [facility] [address] [place] [address] Dept: [phone] Dept Fax: [phone]   Orders Placed This Encounter  Procedures   CBC with Differential (Cancer Center Only)    Standing Status:   Future    Expected Date:   12/03/2024    Expiration Date:   12/03/2024   CMP (Cancer Center only)    Standing Status:   Future    Expected Date:   12/03/2024    Expiration Date:   12/03/2024   I,Jasmine M Lassiter,acting as a scribe for Wanda VEAR Cornish, MD.,have documented all relevant documentation on the behalf of Wanda VEAR Cornish, MD,as directed by  Wanda VEAR Cornish, MD  while in the presence of Wanda VEAR Cornish, MD.

## 2023-12-04 NOTE — Telephone Encounter (Signed)
 Patient has been scheduled for follow-up visit per 12/04/23 LOS.  Pt given an appt calendar with date and time.

## 2024-11-28 ENCOUNTER — Ambulatory Visit (HOSPITAL_BASED_OUTPATIENT_CLINIC_OR_DEPARTMENT_OTHER): Admitting: Radiology

## 2024-12-03 ENCOUNTER — Inpatient Hospital Stay: Admitting: Hematology and Oncology

## 2024-12-03 ENCOUNTER — Inpatient Hospital Stay
